# Patient Record
Sex: Male | Born: 1935 | Race: White | Hispanic: No | Marital: Single | Smoking: Never smoker
Health system: Southern US, Community
[De-identification: ages and names within clinical notes are randomized; demographics above are authoritative.]

## PROBLEM LIST (undated history)

## (undated) DIAGNOSIS — N390 Urinary tract infection, site not specified: Secondary | ICD-10-CM

## (undated) DIAGNOSIS — M1711 Unilateral primary osteoarthritis, right knee: Secondary | ICD-10-CM

## (undated) DIAGNOSIS — N200 Calculus of kidney: Secondary | ICD-10-CM

## (undated) DIAGNOSIS — N400 Enlarged prostate without lower urinary tract symptoms: Secondary | ICD-10-CM

## (undated) DIAGNOSIS — J189 Pneumonia, unspecified organism: Secondary | ICD-10-CM

## (undated) DIAGNOSIS — R0602 Shortness of breath: Secondary | ICD-10-CM

## (undated) DIAGNOSIS — E039 Hypothyroidism, unspecified: Secondary | ICD-10-CM

## (undated) DIAGNOSIS — M549 Dorsalgia, unspecified: Secondary | ICD-10-CM

## (undated) DIAGNOSIS — M1990 Unspecified osteoarthritis, unspecified site: Secondary | ICD-10-CM

## (undated) DIAGNOSIS — F419 Anxiety disorder, unspecified: Secondary | ICD-10-CM

## (undated) DIAGNOSIS — G8929 Other chronic pain: Secondary | ICD-10-CM

## (undated) DIAGNOSIS — G500 Trigeminal neuralgia: Secondary | ICD-10-CM

## (undated) DIAGNOSIS — I10 Essential (primary) hypertension: Secondary | ICD-10-CM

## (undated) HISTORY — PX: HERNIA REPAIR: SHX51

## (undated) HISTORY — PX: EYE SURGERY: SHX253

## (undated) HISTORY — DX: Unilateral primary osteoarthritis, right knee: M17.11

## (undated) HISTORY — PX: HEMORRHOID SURGERY: SHX153

---

## 2003-07-30 ENCOUNTER — Encounter: Payer: Self-pay | Admitting: *Deleted

## 2003-07-30 ENCOUNTER — Encounter: Admission: RE | Admit: 2003-07-30 | Discharge: 2003-07-30 | Payer: Self-pay | Admitting: *Deleted

## 2004-01-29 ENCOUNTER — Emergency Department (HOSPITAL_COMMUNITY): Admission: EM | Admit: 2004-01-29 | Discharge: 2004-01-29 | Payer: Self-pay | Admitting: Family Medicine

## 2006-11-17 ENCOUNTER — Ambulatory Visit: Payer: Self-pay | Admitting: Internal Medicine

## 2007-06-07 ENCOUNTER — Encounter: Admission: RE | Admit: 2007-06-07 | Discharge: 2007-06-07 | Payer: Self-pay | Admitting: Neurology

## 2007-07-19 ENCOUNTER — Ambulatory Visit: Payer: Self-pay | Admitting: Hematology & Oncology

## 2007-08-11 LAB — CBC WITH DIFFERENTIAL/PLATELET
Eosinophils Absolute: 0.1 10*3/uL (ref 0.0–0.5)
HCT: 40.3 % (ref 38.7–49.9)
LYMPH%: 23.7 % (ref 14.0–48.0)
MONO#: 0.5 10*3/uL (ref 0.1–0.9)
NEUT#: 3.8 10*3/uL (ref 1.5–6.5)
Platelets: 233 10*3/uL (ref 145–400)
RBC: 4.24 10*6/uL (ref 4.20–5.71)
WBC: 5.8 10*3/uL (ref 4.0–10.0)
lymph#: 1.4 10*3/uL (ref 0.9–3.3)

## 2007-08-11 LAB — CHCC SMEAR

## 2007-08-15 LAB — SPEP & IFE WITH QIG
Albumin ELP: 60.9 % (ref 55.8–66.1)
Alpha-1-Globulin: 3.6 % (ref 2.9–4.9)
Alpha-2-Globulin: 10.1 % (ref 7.1–11.8)
Total Protein, Serum Electrophoresis: 6.6 g/dL (ref 6.0–8.3)

## 2007-08-15 LAB — COMPREHENSIVE METABOLIC PANEL
ALT: 11 U/L (ref 0–53)
Albumin: 4.1 g/dL (ref 3.5–5.2)
CO2: 21 mEq/L (ref 19–32)
Calcium: 9 mg/dL (ref 8.4–10.5)
Chloride: 109 mEq/L (ref 96–112)
Creatinine, Ser: 0.78 mg/dL (ref 0.40–1.50)
Potassium: 4 mEq/L (ref 3.5–5.3)
Total Protein: 6.6 g/dL (ref 6.0–8.3)

## 2007-08-15 LAB — BETA 2 MICROGLOBULIN, SERUM: Beta-2 Microglobulin: 1.52 mg/L (ref 1.01–1.73)

## 2007-08-17 LAB — UIFE/LIGHT CHAINS/TP QN, 24-HR UR
Alpha 1, Urine: DETECTED — AB
Alpha 2, Urine: DETECTED — AB
Free Kappa Lt Chains,Ur: 18 mg/dL — ABNORMAL HIGH (ref 0.04–1.51)
Free Kappa/Lambda Ratio: 163.64 ratio — ABNORMAL HIGH (ref 0.46–4.00)
Free Lambda Excretion/Day: 1.65 mg/d
Free Lambda Lt Chains,Ur: 0.11 mg/dL (ref 0.08–1.01)
Free Lt Chn Excr Rate: 270 mg/d
Total Protein, Urine: 19.1 mg/dL

## 2008-01-23 ENCOUNTER — Inpatient Hospital Stay (HOSPITAL_COMMUNITY): Admission: AD | Admit: 2008-01-23 | Discharge: 2008-01-30 | Payer: Self-pay | Admitting: Family Medicine

## 2008-01-23 ENCOUNTER — Ambulatory Visit: Payer: Self-pay | Admitting: Family Medicine

## 2008-02-20 ENCOUNTER — Emergency Department (HOSPITAL_COMMUNITY): Admission: EM | Admit: 2008-02-20 | Discharge: 2008-02-20 | Payer: Self-pay | Admitting: Emergency Medicine

## 2008-03-12 ENCOUNTER — Ambulatory Visit (HOSPITAL_COMMUNITY): Admission: RE | Admit: 2008-03-12 | Discharge: 2008-03-12 | Payer: Self-pay | Admitting: Internal Medicine

## 2010-04-23 ENCOUNTER — Ambulatory Visit (HOSPITAL_COMMUNITY): Admission: RE | Admit: 2010-04-23 | Discharge: 2010-04-23 | Payer: Self-pay | Admitting: Internal Medicine

## 2011-03-17 NOTE — Discharge Summary (Signed)
Mark Berger, Mark Berger             ACCOUNT NO.:  [id number]   MEDICAL RECORD NO.:  [id number]          PATIENT TYPE:  INP   LOCATION:  5511                         FACILITY:  [facility]   PHYSICIAN:  Zenaida Deed. Mayford Knife, M.D.DATE OF BIRTH:  11/07/1923   DATE OF ADMISSION:  01/23/2008  DATE OF DISCHARGE:  01/30/2008                               DISCHARGE SUMMARY   ADDENDUM   The patient was kept over the weekend and waiting for a bed at an  assisted living facility.  The patient had no changes over the weekend  and was felt stable for discharge on Monday, January 30, 2008.      Ancil Boozer, MD  Electronically Signed      Zenaida Deed. Mayford Knife, M.D.  Electronically Signed    SA/MEDQ  D:  01/30/2008  T:  01/31/2008  Job:  [id number]

## 2011-03-17 NOTE — H&P (Signed)
Mark Berger, Mark Berger             ACCOUNT NO.:  [id number]   MEDICAL RECORD NO.:  [id number]          PATIENT TYPE:  INP   LOCATION:  5511                         FACILITY:  [facility]   PHYSICIAN:  Pearlean Brownie, M.D.DATE OF BIRTH:  12/18/1923   DATE OF ADMISSION:  01/23/2008  DATE OF DISCHARGE:                              HISTORY & PHYSICAL   PRIMARY CARE PHYSICIAN:  [facility].   CHIEF COMPLAINT:  Back pain, unable to get out of bed.   HISTORY OF PRESENT ILLNESS:  An 77 year old white male injured his lower  back Saturday morning lifting a bag of mulch.  He was lifting the mulch  up and heard and felt a loud pop.  He was immediately to hot tub at [facility]  health club trying to loosen it back up, but did not obtain any relief  from this and went from there to [facility] where he was  diagnosed with musculoskeletal back strain.  Pain continued to worsen  over the weekend to the point that he is unable to get out of bed  because he is unable to rotate due to the pain.  He lives alone and his  neighbor is helping him.  He can ambulate with a cane.  He presented to  [facility] this morning and was sent for admission evaluations  due to worsening symptoms and an inability take care of self at home.  He denies any incontinence, saddle anesthesia, or fevers.  He had been  on Skelaxin and Vicodin since Saturday which did not help all that much.  He does have a 10-12-year history of low back pain which has been  followed periodically by a chiropractor, but he has never had any pain  with similar to this.  That he denies any history of prior back injuries  or trauma.   PAST MEDICAL HISTORY:  1. BPH.  2. Insomnia.  3. Trigeminal arthralgia.  4.   Constipation.  1. Diverticulosis found on colonoscopy, no history of GI bleed.  2. Monoclonal gammopathy of undetermined significance.  Negative bone      scan in August 2008.   MEDICATIONS:  1. Doxazosin 2 mg p.o.  nightly.  2. Lorazepam 0.5 mg p.o. nightly.  3. MiraLax p.r.n.  4. Nasacort.  5. Skelaxin 800 mg p.o. t.i.d. started March 20.  6. Vicodin 5/500 one p.o. q.4-6h p.r.n. started March 20.   ALLERGIES:  No known drug allergies.   FAMILY HISTORY:  Noncontributory.  No history of cancer.  His parents  and siblings all lived well into their 22s and 77s.   SOCIAL HISTORY:  As he is single and has never been married.  He has no  children and lives alone.  He performs all ADLs and IADLs.  He denies  any alcohol or tobacco use.  He retired 16 years ago, form [employer].   REVIEW OF SYSTEMS:  Positive constipation x3 days also decreased  appetite over the past 3 days.  He denies any rashes, numbness,  tingling.  No sciatic or radiation of the back pain.  No dysuria  or  hematuria.   PHYSICAL EXAM:  Vital signs are pending.  GENERAL:  He is alert, no acute distress at rest, but he is very  uncomfortable with any movement.  HEENT: Pupils are equal, round, and reactive to light and accommodation.  Extraocular motions intact.  Mucous membranes are moist.  CARDIOVASCULAR: Regular rate and rhythm.  No murmurs, rubs or gallops.  Lungs are clear to auscultation bilaterally.  Abdominal exam positive bowel sounds, soft, nontender, nondistended.  EXTREMITIES: No edema.  NEURO:  He is alert and oriented x3.  Cranial nerves 2-12 grossly  intact.  5/5 upper and lower strength.  With 2+ DTRs symmetric.  MUSCULOSKELETAL:  Back with full range of motion with flexion but  decreased range of motion with extension, rotation and lateral bending  secondary to pain.  He is without bony tenderness.  He does have some  tenderness to palpation the level of L3-4 on the left side paraspinal.  Negative seated straight leg raise.  Normal toe walk.  He is unable to  do heel walk due to pain, but he does have 5/5 dorsiflexion, plantar  flexion, and FHL strength.  Sensation is grossly normal.  He is able to   ambulate with a very antalgic gait.   LABS/IMAGING:  None available at this time.   ASSESSMENT/PLAN:  An 77 year old male with severe low back pain.  1. Low back pain, no red flag suggesting surgical emergency.  However,      he is incapacitated.  We will admit for observation, check L-spine      films.  Pain control with Vicodin, Skelaxin, and warm compresses.      Will have physical therapy evaluate for home needs as well as      treatment modalities.  Further imaging dependent on x-ray results.  2. Constipation.  Will start scheduled MiraLax.  3. SVN regular diet.  Will check a serum albumin.  4. Deep vein thrombosis prophylaxis.  Will start Lovenox.  5. Benign prostatic hypertrophy.  No acute issues.  Continue      doxazosin.  6. Insomnia.  No acute issues.  Will continue low-dose lorazepam as      needed.   DISPOSITION:  Pending evaluation and ability to care for self at home      Benn Moulder, M.D.  Electronically Signed      Pearlean Brownie, M.D.  Electronically Signed    MR/MEDQ  D:  01/23/2008  T:  01/23/2008  Job:  [id number]   cc:   [facility]

## 2011-03-17 NOTE — Discharge Summary (Signed)
Mark Berger, Mark Berger             ACCOUNT NO.:  [id number]   MEDICAL RECORD NO.:  [id number]          PATIENT TYPE:  INP   LOCATION:  5511                         FACILITY:  [facility]   PHYSICIAN:  Pearlean Brownie, BergerDATE OF BIRTH:  1923-12-12   DATE OF ADMISSION:  01/23/2008  DATE OF DISCHARGE:                               DISCHARGE SUMMARY   REASON FOR ADMISSION:  Acute on chronic back pain with inability to  complete activities of daily living.   DISCHARGE DIAGNOSES:  1. Acute on chronic back pain.  2. Benign prostatic hypertrophy.  3. Insomnia.  4. Constipation.  5. History of trigeminal neuralgia.   DISCHARGE MEDICATIONS:  1. Doxazosin 2 mg p.o. q.h.s.  2. Lorazepam 0.5 mg p.o. q.h.s.  3. Nasacort nasal spray p.r.n.  4. Vicodin 5/500 q. 4-6 hours p.r.n. pain.  5. MiraLax 17 gram packet p.o. 1-2 times daily p.r.n. constipation.  6. Colace 100 mg p.o. q. day while taking Vicodin.  7. Ibuprofen 600 mg p.o. q. 6 hours p.r.n.  8. Calcium 500 mg p.o. t.i.d.  9. Vitamin D 400 international units p.o. b.i.d.  10.Protonix  40 mg p.o. q. day.   CONSULTATIONS:  There was a consultation for physical therapy and  occupational therapy.  Also, a social work and case management consult.   PROCEDURES AND STUDIES:  Plain films of the thoracic and lumbar spine on  March 23rd revealed a T12 wedge compression fracture which was new since  June 07, 2007, and mild lumbar degenerative changes but otherwise was  unremarkable.   LABORATORY DATA:  On admission, patient's CBC revealed white blood cell  count 8.3, hemoglobin 13.9, hematocrit 41.4, platelet count 207.  Comprehensive metabolic panel was within normal limits except for a  glucose of 173.  By the time of discharge, patient's basic metabolic  panel was within normal limits with the exception of a glucose of 112,  and Mark CBC remained unchanged.   HOSPITAL COURSE:  1. Back pain.  Mark Berger is a pleasant 77 year old gentleman  who      presented with acute on chronic back pain after lifting a heavy bag      at home.  Patient had no red flag symptoms but continued to have      significant pain and difficulty with ambulation at time of      admission.  He was unable to do Mark activities of daily living at      time of admissionl; therefore, he was admitted.  X-rays were      performed and information was noted as above.  It was unclear      whether or not this fracture was acute in terms of happened with      Mark lifting or if it had happened earlier in time.  For this      reason, it was not felt that the patient would benefit from an      orthopedic consultation for a consideration of vertebroplasty or      kyphoplasty.  Patient was started on calcitonin for bony pain,      which is  sometimes helpful in acute back pain;however, this did not      provide significant relief.  The patient was started on scheduled      Vicodin and ibuprofen, which did help Mark pain enough that he was      able to cooperate with physical therapy and occupational therapy.      He improved slowly through their help, but family members and the      patient himself did not feel comfortable with him going home in Mark      current state.  Thus, it was pursued via social work to get the      patient to an assisted living facility with physical therapy and      occupational therapy while he is there.  We feel that he will do      well in this situation.  2. Benign prostatic hypertrophy.  The patient was continued on Mark      doxazosin.  This is actually an older medication for treatment of      benign prostatic hypertrophy.  The patient may benefit from either      addition of a beta blocker to avoid extra strain on the heart of      the unopposed alpha or a change to a medication such as Flomax for      Mark benign prostatic hypertrophy, as this would not have the      effects on the heart.  We have deferred this change to Mark primary       care physician.  3. Insomnia.  The patient was continued on Mark Ativan per Mark home      regimen and tolerated this well.  4. Constipation.  The patient initially on x-ray appeared to have a      moderate gas and stool burden.  For this reason, he was started on      MiraLax, Colace and actually Milk of Magnesia was added as well.      By day 2, Mark bowel movements had regulated and by time of      discharge, he had no problems with Mark bowel movements.  He will be      continued on the medications as listed above to help Mark bowel      movements stay regular.   INSTRUCTIONS AND FOLLOWUP:  The patient will be going to assisted living  facility.  He is to follow a low sodium, heart healthy diet.  He is to  increase Mark activity slowly and work with physical therapy and  occupational therapy per their recommendations.  He is to return to Mark  primary M.D. in approximately 1-2 weeks and will need to call for this  appointment.  I would recommend to Mark primary M.D. the change as above  with Flomax but also a consideration of getting a DEXA scan to eval for  osteoporosis and starting a bisphosphonate given the patient's recent  vertebral fracture.  He may in fact have osteoporosis and would benefit  from this treatment.      Ancil Boozer, MD  Electronically Signed      Pearlean Brownie, M.D.  Electronically Signed    SA/MEDQ  D:  01/27/2008  T:  01/27/2008  Job:  [id number]

## 2011-03-20 NOTE — Assessment & Plan Note (Signed)
[facility]                         GASTROENTEROLOGY OFFICE NOTE   Mark Berger, Mark Berger                    MRN:          [id number]  DATE:11/17/2006                            DOB:          29-Dec-1923    REFERRING PHYSICIAN:  Gaspar Garbe, M.D.   REASON FOR CONSULTATION:  Constipation.   HISTORY:  This is an 77 year old white male who is referred for ongoing  complaints with constipation.  The patient reports having had regular  bowel habits as manifested by two nicely-formed bowel movements each  morning until about 3 years ago.  Thereafter he has noticed that he has  had to strain and may even go a day without having a bowel movement.  For these complaints, he was directly referred for colonoscopy August 21, 2004.  The examination revealed marked left-sided diverticulosis and  internal hemorrhoids.  He has not been seen since.  The patient has also  had problems with benign prostatic hypertrophy and obstructive uropathy.  The patient himself feels that his urologic problems are in part  secondary to his constipation.  For his bowels, he takes one stool  softener daily and Metamucil intermittently.  This does help some.  He  denies nausea, vomiting, abdominal pain, or hematochezia.  He has lost  21 pounds but this is strictly due to dietary change at the  recommendation of his internist.   PAST MEDICAL HISTORY:  1. Benign prostatic hypertrophy with a history of obstructive      uropathy.  2. Incidental diverticulosis.  3. Status post inguinal hernia repair on the left.  4. Status post hemorrhoidectomy in 1961.  5. Status post cataract surgery June 2007.   FAMILY HISTORY:  No family history of gastrointestinal malignancy.   SOCIAL HISTORY:  The patient is single without children.  He has a 12th-  grade education.  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  Does not smoke or  use  alcohol.   REVIEW OF SYSTEMS:  Per diagnostic evaluation form.   PHYSICAL EXAMINATION:  GENERAL:  Well-appearing male in no acute  distress.  VITAL SIGNS:  Blood pressure is 152/64, heart rate is 80 and regular,  weight is 204.6 pounds.  He is 5 feet 11.5 inches in height.  HEENT:  Sclerae are anicteric, conjunctivae are pink.  Oral mucosa is  intact, no adenopathy.  LUNGS:  Clear.  HEART:  Regular.  ABDOMEN:  Soft without tenderness, mass or hernia.  RECTAL:  Reveals no tenderness, mass.  Stool is brown, soft, hemoccult  negative.   IMPRESSION:  1. Functional constipation.  2. Incidental diverticulosis.   RECOMMENDATIONS:  1. Metamucil one tablespoon daily in 12 ounces of water.  2. Glycolax 17 g in 10 ounces of water daily.  The patient may titrate      this to need.  3. Resume general medical care with Dr. Wylene Simmer.     Wilhemina Bonito. Marina Goodell, MD  Electronically Signed    JNP/MedQ  DD: 11/17/2006  DT: 11/17/2006  Job #: [id number]   cc:   Adelfa Koh.  Tis[place]c, M.D.  [employer], M.D.

## 2011-04-28 ENCOUNTER — Ambulatory Visit (HOSPITAL_COMMUNITY): Payer: Medicare Other | Attending: Internal Medicine

## 2011-04-28 DIAGNOSIS — M810 Age-related osteoporosis without current pathological fracture: Secondary | ICD-10-CM | POA: Insufficient documentation

## 2011-07-27 LAB — BASIC METABOLIC PANEL
BUN: 13
BUN: 17
Calcium: 9.2
Chloride: 103
GFR calc non Af Amer: >60
GFR calc non Af Amer: >60
Glucose, Bld: 120 — ABNORMAL HIGH
Potassium: 3.8
Potassium: 4.4
Sodium: 136
Sodium: 139

## 2011-07-27 LAB — COMPREHENSIVE METABOLIC PANEL
AST: 18
Alkaline Phosphatase: 60
BUN: 16
CO2: 24
Chloride: 100
Creatinine, Ser: 0.77
GFR calc non Af Amer: >60
Potassium: 3.7
Total Bilirubin: 1.2

## 2011-07-27 LAB — CBC
HCT: 38.1 — ABNORMAL LOW
HCT: 41.4
Hemoglobin: 13.6
MCV: 97.1
Platelets: 207
RBC: 4.09 — ABNORMAL LOW
RBC: 4.26
RDW: 13.5
WBC: 5.3
WBC: 8.3
WBC: 8.4

## 2011-07-28 LAB — DIFFERENTIAL
Basophils Absolute: 0
Basophils Relative: 1
Eosinophils Relative: 3
Monocytes Absolute: 0.4
Monocytes Relative: 8

## 2011-07-28 LAB — CBC
HCT: 39
Hemoglobin: 13.3
MCHC: 34.1
RBC: 4.13 — ABNORMAL LOW
RDW: 13.4

## 2011-07-28 LAB — BASIC METABOLIC PANEL
CO2: 26
Chloride: 103
GFR calc Af Amer: >60
Glucose, Bld: 154 — ABNORMAL HIGH
Potassium: 3.6
Sodium: 137

## 2011-07-28 LAB — POCT CARDIAC MARKERS
CKMB, poc: <1 — ABNORMAL LOW
Troponin i, poc: <0.05

## 2011-12-19 ENCOUNTER — Observation Stay (HOSPITAL_COMMUNITY)
Admission: EM | Admit: 2011-12-19 | Discharge: 2011-12-20 | Disposition: A | Discharge status: Home / Self Care | Payer: Medicare Other | Attending: Internal Medicine | Admitting: Internal Medicine

## 2011-12-19 ENCOUNTER — Other Ambulatory Visit: Payer: Self-pay

## 2011-12-19 ENCOUNTER — Emergency Department (HOSPITAL_COMMUNITY): Payer: Medicare Other

## 2011-12-19 ENCOUNTER — Encounter (HOSPITAL_COMMUNITY): Payer: Self-pay

## 2011-12-19 DIAGNOSIS — N390 Urinary tract infection, site not specified: Secondary | ICD-10-CM | POA: Diagnosis present

## 2011-12-19 DIAGNOSIS — R55 Syncope and collapse: Principal | ICD-10-CM | POA: Diagnosis present

## 2011-12-19 DIAGNOSIS — R42 Dizziness and giddiness: Secondary | ICD-10-CM | POA: Insufficient documentation

## 2011-12-19 DIAGNOSIS — N400 Enlarged prostate without lower urinary tract symptoms: Secondary | ICD-10-CM | POA: Diagnosis present

## 2011-12-19 DIAGNOSIS — G8929 Other chronic pain: Secondary | ICD-10-CM

## 2011-12-19 DIAGNOSIS — G500 Trigeminal neuralgia: Secondary | ICD-10-CM | POA: Diagnosis present

## 2011-12-19 DIAGNOSIS — M545 Low back pain, unspecified: Secondary | ICD-10-CM | POA: Insufficient documentation

## 2011-12-19 DIAGNOSIS — R404 Transient alteration of awareness: Secondary | ICD-10-CM | POA: Insufficient documentation

## 2011-12-19 DIAGNOSIS — M179 Osteoarthritis of knee, unspecified: Secondary | ICD-10-CM | POA: Diagnosis present

## 2011-12-19 DIAGNOSIS — M1710 Unilateral primary osteoarthritis, unspecified knee: Secondary | ICD-10-CM | POA: Insufficient documentation

## 2011-12-19 HISTORY — DX: Unspecified osteoarthritis, unspecified site: M19.90

## 2011-12-19 HISTORY — DX: Trigeminal neuralgia: G50.0

## 2011-12-19 LAB — COMPREHENSIVE METABOLIC PANEL
Albumin: 3.9 g/dL (ref 3.5–5.2)
Alkaline Phosphatase: 56 U/L (ref 39–117)
BUN: 15 mg/dL (ref 6–23)
CO2: 25 mEq/L (ref 19–32)
Chloride: 101 mEq/L (ref 96–112)
Creatinine, Ser: 0.77 mg/dL (ref 0.50–1.35)
GFR calc Af Amer: >90 mL/min (ref >90)
GFR calc non Af Amer: 80 mL/min — ABNORMAL LOW (ref >90)
Glucose, Bld: 123 mg/dL — ABNORMAL HIGH (ref 70–99)
Potassium: 4.2 mEq/L (ref 3.5–5.1)
Total Bilirubin: 0.7 mg/dL (ref 0.3–1.2)

## 2011-12-19 LAB — URINALYSIS, ROUTINE W REFLEX MICROSCOPIC
Glucose, UA: >1000 mg/dL — AB
Ketones, ur: 15 mg/dL — AB
Nitrite: POSITIVE — AB
Protein, ur: 30 mg/dL — AB
Urobilinogen, UA: 0.2 mg/dL (ref 0.0–1.0)

## 2011-12-19 LAB — CBC
HCT: 43.1 % (ref 39.0–52.0)
HCT: 41.4 % (ref 39.0–52.0)
Hemoglobin: 14.9 g/dL (ref 13.0–17.0)
MCH: 32.5 pg (ref 26.0–34.0)
MCHC: 34.6 g/dL (ref 30.0–36.0)
MCV: 93.5 fL (ref 78.0–100.0)
Platelets: 218 10*3/uL (ref 150–400)
RBC: 4.59 MIL/uL (ref 4.22–5.81)
RBC: 4.43 MIL/uL (ref 4.22–5.81)
WBC: 7.6 10*3/uL (ref 4.0–10.5)

## 2011-12-19 LAB — CREATININE, SERUM
GFR calc Af Amer: >90 mL/min (ref >90)
GFR calc non Af Amer: 82 mL/min — ABNORMAL LOW (ref >90)

## 2011-12-19 LAB — DIFFERENTIAL
Basophils Relative: 0 % (ref 0–1)
Lymphs Abs: 1.1 10*3/uL (ref 0.7–4.0)
Monocytes Absolute: 0.7 10*3/uL (ref 0.1–1.0)
Monocytes Relative: 9 % (ref 3–12)
Neutro Abs: 5.7 10*3/uL (ref 1.7–7.7)
Neutrophils Relative %: 76 % (ref 43–77)

## 2011-12-19 LAB — URINE MICROSCOPIC-ADD ON

## 2011-12-19 LAB — CARDIAC PANEL(CRET KIN+CKTOT+MB+TROPI)
Relative Index: INVALID (ref 0.0–2.5)
Total CK: 39 U/L (ref 7–232)

## 2011-12-19 LAB — APTT: aPTT: 26 seconds (ref 24–37)

## 2011-12-19 MED ORDER — ZOLPIDEM TARTRATE 5 MG PO TABS: 5.0000 mg | ORAL_TABLET | Freq: Every evening | ORAL | Status: DC | PRN

## 2011-12-19 MED ORDER — HYDROCODONE-ACETAMINOPHEN 5-325 MG PO TABS: 1.0000 | ORAL_TABLET | ORAL | Status: DC | PRN

## 2011-12-19 MED ORDER — SODIUM CHLORIDE 0.9 % IV SOLN: INTRAVENOUS | Status: DC

## 2011-12-19 MED ORDER — CIPROFLOXACIN IN D5W 400 MG/200ML IV SOLN
400.0000 mg | Freq: Once | INTRAVENOUS | Status: AC
  Administered 2011-12-19: 400 mg via INTRAVENOUS
  Filled 2011-12-19: qty 200

## 2011-12-19 MED ORDER — POTASSIUM CHLORIDE IN NACL 20-0.9 MEQ/L-% IV SOLN
INTRAVENOUS | Status: DC
  Administered 2011-12-19: 21:00:00 via INTRAVENOUS
  Filled 2011-12-19 (×2): qty 1000

## 2011-12-19 MED ORDER — ACETAMINOPHEN 325 MG PO TABS: 650.0000 mg | ORAL_TABLET | Freq: Four times a day (QID) | ORAL | Status: DC | PRN

## 2011-12-19 MED ORDER — ENOXAPARIN SODIUM 40 MG/0.4ML ~~LOC~~ SOLN
40.0000 mg | SUBCUTANEOUS | Status: DC
  Administered 2011-12-19: 40 mg via SUBCUTANEOUS
  Filled 2011-12-19 (×2): qty 0.4

## 2011-12-19 MED ORDER — SODIUM CHLORIDE 0.9 % IV BOLUS (SEPSIS)
500.0000 mL | Freq: Once | INTRAVENOUS | Status: AC
  Administered 2011-12-19: 500 mL via INTRAVENOUS

## 2011-12-19 MED ORDER — NAPROXEN 250 MG PO TABS
250.0000 mg | ORAL_TABLET | ORAL | Status: AC
  Administered 2011-12-19: 250 mg via ORAL
  Filled 2011-12-19: qty 1

## 2011-12-19 MED ORDER — ASPIRIN EC 325 MG PO TBEC
325.0000 mg | DELAYED_RELEASE_TABLET | Freq: Every day | ORAL | Status: DC
  Administered 2011-12-19 – 2011-12-20 (×2): 325 mg via ORAL
  Filled 2011-12-19 (×2): qty 1

## 2011-12-19 MED ORDER — NAPROXEN 250 MG PO TABS
250.0000 mg | ORAL_TABLET | Freq: Two times a day (BID) | ORAL | Status: DC
  Administered 2011-12-19 – 2011-12-20 (×2): 250 mg via ORAL
  Filled 2011-12-19 (×4): qty 1

## 2011-12-19 MED ORDER — SODIUM CHLORIDE 0.9 % IJ SOLN
3.0000 mL | Freq: Two times a day (BID) | INTRAMUSCULAR | Status: DC
  Administered 2011-12-19: 3 mL via INTRAVENOUS

## 2011-12-19 MED ORDER — CALCIUM CARBONATE-VITAMIN D 500-200 MG-UNIT PO TABS
1.0000 | ORAL_TABLET | Freq: Every day | ORAL | Status: DC
  Administered 2011-12-19: 1 via ORAL
  Filled 2011-12-19 (×2): qty 1

## 2011-12-19 MED ORDER — CIPROFLOXACIN HCL 500 MG PO TABS
500.0000 mg | ORAL_TABLET | Freq: Two times a day (BID) | ORAL | Status: DC
  Administered 2011-12-19 – 2011-12-20 (×2): 500 mg via ORAL
  Filled 2011-12-19 (×4): qty 1

## 2011-12-19 MED ORDER — OMEGA-3-ACID ETHYL ESTERS 1 G PO CAPS
3.0000 g | ORAL_CAPSULE | Freq: Two times a day (BID) | ORAL | Status: DC
  Administered 2011-12-19 – 2011-12-20 (×2): 3 g via ORAL
  Filled 2011-12-19 (×3): qty 3

## 2011-12-19 MED ORDER — SIMVASTATIN 20 MG PO TABS
20.0000 mg | ORAL_TABLET | Freq: Every day | ORAL | Status: DC
  Administered 2011-12-19 – 2011-12-20 (×2): 20 mg via ORAL
  Filled 2011-12-19 (×2): qty 1

## 2011-12-19 MED ORDER — SENNOSIDES-DOCUSATE SODIUM 8.6-50 MG PO TABS
1.0000 | ORAL_TABLET | Freq: Every evening | ORAL | Status: DC | PRN
  Filled 2011-12-19: qty 1

## 2011-12-19 MED ORDER — ACETAMINOPHEN 650 MG RE SUPP: 650.0000 mg | Freq: Four times a day (QID) | RECTAL | Status: DC | PRN

## 2011-12-19 MED ORDER — LORAZEPAM 0.5 MG PO TABS
0.5000 mg | ORAL_TABLET | Freq: Every day | ORAL | Status: DC
  Administered 2011-12-19: 0.5 mg via ORAL
  Filled 2011-12-19: qty 1

## 2011-12-19 MED ORDER — NAPROXEN 250 MG PO TABS
250.0000 mg | ORAL_TABLET | Freq: Three times a day (TID) | ORAL | Status: DC
  Administered 2011-12-20: 250 mg via ORAL
  Filled 2011-12-19 (×4): qty 1

## 2011-12-19 MED ORDER — NAPROXEN SODIUM 220 MG PO TABS: 220.0000 mg | ORAL_TABLET | Freq: Three times a day (TID) | ORAL | Status: DC

## 2011-12-19 MED ORDER — BISACODYL 10 MG RE SUPP: 10.0000 mg | Freq: Every day | RECTAL | Status: DC | PRN

## 2011-12-19 NOTE — ED Notes (Signed)
Attempted to give floor report unable

## 2011-12-19 NOTE — ED Notes (Signed)
Pt reports he was eating at a restaurant with a friend and he had a "full sensation in stomach, pt began feeling bad, dizzy, and became very sweaty across the brow".  Per EMS, pt was lowered to the floor by his friend.  Upon EMS arrival pt was alert and responsive.  Orthostatic BP completed by EMS:  Lying 150/84, Sitting 130/84, Standing 140/P.  Per EMS pulse stayed in the 90's, CBG 125, EKG was NSR.

## 2011-12-19 NOTE — ED Provider Notes (Signed)
History     CSN: [id number]  Arrival date & time 12/19/11  1250   First MD Initiated Contact with Patient 12/19/11 1305      Chief Complaint  Patient presents with  . Loss of Consciousness    (Consider location/radiation/quality/duration/timing/severity/associated sxs/prior treatment) HPI Pt states was in normal state of health eating at restaurant and had full feeling. Became light headed and unresponsive and was gently lowered to floor. Pt stated he remembers being lowered to the floor. Friend stated he had some jerking motions but no bowel or bladder incontinence. No post ictal phase. Pt says he feel back to normal. Denies CP, SOB fever, HA, neck pain, N/V, abdominal pain.   Past Medical History  Diagnosis Date  . Arthritis   . Trigeminal neuralgia     Past Surgical History  Procedure Date  . Hernia repair   . Hemmor   . Hemorrhoid surgery     History reviewed. No pertinent family history.  History  Substance Use Topics  . Smoking status: Never Smoker   . Smokeless tobacco: Not on file  . Alcohol Use: No      Review of Systems  Constitutional: Negative for fever and chills.  HENT: Negative for neck pain.   Respiratory: Negative for cough, shortness of breath and wheezing.   Cardiovascular: Negative for chest pain, palpitations and leg swelling.  Gastrointestinal: Negative for nausea, vomiting and abdominal pain.  Genitourinary: Negative for dysuria and flank pain.  Musculoskeletal: Negative for back pain and joint swelling.  Neurological: Positive for dizziness, syncope and light-headedness. Negative for weakness, numbness and headaches.    Allergies  Review of patient's allergies indicates no known allergies.  Home Medications  No current outpatient prescriptions on file.  BP 137/73  Pulse 88  Temp(Src) 98 F (36.7 C) (Oral)  Resp 19  Ht 5\' 10"  (1.778 m)  Wt 206 lb 5.6 oz (93.6 kg)  BMI 29.61 kg/m2  SpO2 95%  Physical Exam  Nursing note and  vitals reviewed. Constitutional: He is oriented to person, place, and time. He appears well-developed and well-nourished. No distress.  HENT:  Head: Normocephalic and atraumatic.  Mouth/Throat: Oropharynx is clear and moist.  Eyes: EOM are normal. Pupils are equal, round, and reactive to light.  Neck: Normal range of motion. Neck supple.       No post cervical midline TTP   Cardiovascular: Normal rate and regular rhythm.   Pulmonary/Chest: Effort normal and breath sounds normal. No respiratory distress. He has no wheezes. He has no rales.  Abdominal: Soft. Bowel sounds are normal. He exhibits no distension. There is no tenderness. There is no rebound and no guarding.  Musculoskeletal: Normal range of motion. He exhibits no edema and no tenderness.  Neurological: He is alert and oriented to person, place, and time.       Finger-to-nose intact, 5/5 motor in all ext, sensation normal  Skin: Skin is warm and dry. No rash noted. No erythema.  Psychiatric: He has a normal mood and affect. His behavior is normal.    ED Course  Procedures (including critical care time)  Labs Reviewed  COMPREHENSIVE METABOLIC PANEL - Abnormal; Notable for the following:    Glucose, Bld 123 (*)    GFR calc non Af Amer 80 (*)    All other components within normal limits  URINALYSIS, ROUTINE W REFLEX MICROSCOPIC - Abnormal; Notable for the following:    Glucose, UA >1000 (*)    Ketones, ur 15 (*)  Protein, ur 30 (*)    Nitrite POSITIVE (*)    Leukocytes, UA SMALL (*)    All other components within normal limits  URINE MICROSCOPIC-ADD ON - Abnormal; Notable for the following:    Bacteria, UA MANY (*)    All other components within normal limits  CREATININE, SERUM - Abnormal; Notable for the following:    GFR calc non Af Amer 82 (*)    All other components within normal limits  COMPREHENSIVE METABOLIC PANEL - Abnormal; Notable for the following:    Glucose, Bld 114 (*)    Total Protein 5.9 (*)     Albumin 3.3 (*)    GFR calc non Af Amer 80 (*)    All other components within normal limits  CBC  DIFFERENTIAL  CARDIAC PANEL(CRET KIN+CKTOT+MB+TROPI)  PROTIME-INR  APTT  TROPONIN I  CBC  URINE CULTURE  CBC   Ct Head Wo Contrast  12/19/2011  *RADIOLOGY REPORT*  Clinical Data: Syncope.  CT HEAD WITHOUT CONTRAST  Technique:  Contiguous axial images were obtained from the base of the skull through the vertex without contrast.  Comparison: None.  Findings: Partial opacification left mastoid air cells.  No bony destructive lesion.  No intracranial hemorrhage.  Global atrophy without hydrocephalus.  Small vessel disease type changes. No CT evidence of large acute infarct.  Small acute infarct cannot be excluded by CT.  No intracranial mass lesion detected on this unenhanced exam.  Calcified vessels.  IMPRESSION: Global atrophy without hydrocephalus.  Small vessel disease type changes.  No intracranial hemorrhage or CT evidence of large acute infarct.  Partial opacification left mastoid air cells.  Original Report Authenticated By: Fuller Canada, M.D.     1. Syncope   2. UTI (lower urinary tract infection)      Date: 12/19/2011  Rate: 88  Rhythm: normal sinus rhythm  QRS Axis: normal  Intervals: normal  ST/T Wave abnormalities: normal  Conduction Disutrbances:none  Narrative Interpretation:   Old EKG Reviewed: unchanged      MDM          Loren Racer, MD 12/20/11 [phone]
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
## 2011-12-19 NOTE — ED Notes (Signed)
EMERGENCY CONTACT:    Lodema Pilot  [phone]

## 2011-12-19 NOTE — ED Notes (Signed)
Pt's friend now at bedside.  She states:  "color drained from his face, I ran around to him and he felt as if he was having mild convulsing, and then we lowered him to the floor."  Pt's friend states that she is not sure, but it may have lasted 30 to 45 seconds.

## 2011-12-20 ENCOUNTER — Other Ambulatory Visit: Payer: Self-pay

## 2011-12-20 LAB — COMPREHENSIVE METABOLIC PANEL
ALT: 12 U/L (ref 0–53)
AST: 12 U/L (ref 0–37)
Albumin: 3.3 g/dL — ABNORMAL LOW (ref 3.5–5.2)
CO2: 24 mEq/L (ref 19–32)
Calcium: 9.3 mg/dL (ref 8.4–10.5)
Chloride: 105 mEq/L (ref 96–112)
GFR calc non Af Amer: 80 mL/min — ABNORMAL LOW (ref >90)
Sodium: 138 mEq/L (ref 135–145)

## 2011-12-20 LAB — TROPONIN I: Troponin I: <0.3 ng/mL (ref <0.30)

## 2011-12-20 MED ORDER — CIPROFLOXACIN HCL 500 MG PO TABS: 500.0000 mg | ORAL_TABLET | Freq: Two times a day (BID) | ORAL | Status: AC

## 2011-12-20 NOTE — Progress Notes (Signed)
IV d/c'ed. Tele d/c'ed. D/C instructions and medications discussed with pt and pt's family. All questions answered. Pt states understanding. Pt escorted out with staff via wheelchair. Pt d/c'ed to home.

## 2011-12-20 NOTE — Progress Notes (Signed)
*  PRELIMINARY RESULTS* Echocardiogram 2D Echocardiogram has been performed.  Mark Berger [facility] 12/20/2011, 10:08 AM

## 2011-12-20 NOTE — Discharge Summary (Signed)
Physician Discharge Summary  Patient ID: Mark Berger MRN: [id number] DOB/AGE: 12-30-1923 78 y.o.  Admit date: 12/19/2011 Discharge date: 12/20/2011   Discharge Diagnoses:  Principal Problem:  *Syncope Active Problems:  Trigeminal neuralgia  Osteoarthritis of knee  Urinary tract infection  Benign prostate hyperplasia   Discharged Condition: good  Hospital Course: The patient is an 78 year old Caucasian male with several medical problems who was in his usual state of good health earlier on the day of admission. He was with a friend at [employer] where he developed a sudden feeling of malaise associated with marked lightheadedness. His friend and others in the restaurant noted that he became pale was some tonic movements and eased him to the floor. He had a brief loss of consciousness, 911 was called, and he was transported to the emergency room for evaluation. The workup in the emergency room included an EKG, labs, and a CT scan of the head which were remarkable solely for bacteriuria. At the time of my evaluation he felt fine other than his usual right ophthalmic branch distribution trigeminal neuralgia aching discomfort. He denied having recent symptoms of chest pain, palpitations, or shortness of breath. He had a similar episode of syncope that occurred while taking pain medication on an empty stomach about 5 years ago, with an emergency room evaluation at that time that was unremarkable. He has no history of coronary artery disease or arrhythmia, and there is no family history of sudden cardiac death. His review of systems is significant for recent urinary frequency and dysuria without fever or chills. Several weeks ago he had a urinary tract infection that resolved with antibiotic treatment. He does have a history of benign prostatic hypertrophy.  He was admitted to a telemetry bed. Overnight telemetry showed that he remained in a sinus rhythm with no bradycardia or tachyarrhythmias. He  had cardiac isoenzymes drawn that were normal. On the day of discharge he had a transthoracic echocardiogram done with results pending at the time of dictation.  Consults: None  Significant Diagnostic Studies:  Ct Head Wo Contrast  12/19/2011  *RADIOLOGY REPORT*  Clinical Data: Syncope.  CT HEAD WITHOUT CONTRAST  Technique:  Contiguous axial images were obtained from the base of the skull through the vertex without contrast.  Comparison: None.  Findings: Partial opacification left mastoid air cells.  No bony destructive lesion.  No intracranial hemorrhage.  Global atrophy without hydrocephalus.  Small vessel disease type changes. No CT evidence of large acute infarct.  Small acute infarct cannot be excluded by CT.  No intracranial mass lesion detected on this unenhanced exam.  Calcified vessels.  IMPRESSION: Global atrophy without hydrocephalus.  Small vessel disease type changes.  No intracranial hemorrhage or CT evidence of large acute infarct.  Partial opacification left mastoid air cells.  Original Report Authenticated By: Fuller Canada, M.D.    Labs: Lab Results  Component Value Date   WBC 7.6 12/19/2011   HGB 14.6 12/19/2011   HCT 41.4 12/19/2011   MCV 93.5 12/19/2011   PLT 218 12/19/2011     Lab 12/20/11 0455  NA 138  K 4.2  CL 105  CO2 24  BUN 16  CREATININE 0.77  CALCIUM 9.3  PROT 5.9*  BILITOT 0.9  ALKPHOS 48  ALT 12  AST 12  GLUCOSE 114*       Lab Results  Component Value Date   INR 0.93 12/19/2011     No results found for this or any previous visit (from the  past 240 hour(s)).    Discharge Exam: Blood pressure 137/73, pulse 88, temperature 98 F (36.7 C), temperature source Oral, resp. rate 19, height 5\' 10"  (1.778 m), weight 93.6 kg (206 lb 5.6 oz), SpO2 95.00%.  Physical Exam: In general, he is a well-nourished well-developed Caucasian man who was in no apparent distress. HEENT exam was within normal limits, neck was supple without jugular venous distention or  carotid bruit, chest was clear to auscultation, heart had a regular rate and rhythm without significant murmur or gallop, abdomen was benign, extremities were without edema, neurologic exam was nonfocal  Disposition: He'll be discharged from the hospital to return home in the company of his friend. He was advised to call our office early this coming week to schedule a followup visit with his primary care physician, Dr. Guerry Bruin. The results of his echocardiogram from today could be reviewed at that time.   Discharge Orders    Future Orders Please Complete By Expires   Diet - low sodium heart healthy      Increase activity slowly      Discharge instructions      Comments:   He should call [facility] at [phone] for a followup visit with his primary care physician within one week of discharge. He should call us if he has recurrent episodes of severe dizziness, fainting, shortness of breath, chest pain, or palpitations.   Call MD for:      Comments:   Call his primary care physician if he develops worsening dizziness, recurrent fainting, palpitations, chest pain, shortness of breath, or other concerning symptoms.     Medication List  As of 12/20/2011  1:58 PM   TAKE these medications         atorvastatin 10 MG tablet   Commonly known as: LIPITOR   Take 10 mg by mouth daily.      calcium-vitamin D 500-200 MG-UNIT per tablet   Commonly known as: OSCAL WITH D   Take 1 tablet by mouth at bedtime.      ciprofloxacin 500 MG tablet   Commonly known as: CIPRO   Take 1 tablet (500 mg total) by mouth 2 (two) times daily.      LORazepam 1 MG tablet   Commonly known as: ATIVAN   Take 0.5 mg by mouth at bedtime. For sleep      naproxen sodium 220 MG tablet   Commonly known as: ANAPROX   Take 220 mg by mouth 3 (three) times daily with meals.      omega-3 acid ethyl esters 1 G capsule   Commonly known as: LOVAZA   Take 3 g by mouth 2 (two) times daily.            Follow-up Information    Follow up with Gaspar Garbe, MD in 1 week.         Signed: Garlan Fillers 12/20/2011, 1:58 PM

## 2011-12-21 LAB — URINE CULTURE
Culture  Setup Time: 201302171110
Culture: NO GROWTH
Special Requests: NORMAL

## 2012-02-11 ENCOUNTER — Emergency Department (HOSPITAL_COMMUNITY)
Admission: EM | Admit: 2012-02-11 | Discharge: 2012-02-11 | Disposition: A | Discharge status: Home / Self Care | Payer: Medicare Other | Attending: Emergency Medicine | Admitting: Emergency Medicine

## 2012-02-11 ENCOUNTER — Encounter (HOSPITAL_COMMUNITY): Payer: Self-pay | Admitting: Emergency Medicine

## 2012-02-11 ENCOUNTER — Emergency Department (HOSPITAL_COMMUNITY): Payer: Medicare Other

## 2012-02-11 DIAGNOSIS — K5900 Constipation, unspecified: Secondary | ICD-10-CM | POA: Insufficient documentation

## 2012-02-11 DIAGNOSIS — N201 Calculus of ureter: Secondary | ICD-10-CM | POA: Insufficient documentation

## 2012-02-11 DIAGNOSIS — R1031 Right lower quadrant pain: Secondary | ICD-10-CM | POA: Insufficient documentation

## 2012-02-11 DIAGNOSIS — N200 Calculus of kidney: Secondary | ICD-10-CM

## 2012-02-11 LAB — URINALYSIS, ROUTINE W REFLEX MICROSCOPIC
Glucose, UA: NEGATIVE mg/dL
Protein, ur: NEGATIVE mg/dL
Specific Gravity, Urine: 1.016 (ref 1.005–1.030)
pH: 6.5 (ref 5.0–8.0)

## 2012-02-11 LAB — BASIC METABOLIC PANEL
CO2: 22 mEq/L (ref 19–32)
Chloride: 102 mEq/L (ref 96–112)
Sodium: 134 mEq/L — ABNORMAL LOW (ref 135–145)

## 2012-02-11 LAB — CBC
HCT: 41.7 % (ref 39.0–52.0)
Platelets: 226 10*3/uL (ref 150–400)
RBC: 4.36 MIL/uL (ref 4.22–5.81)
RDW: 13.6 % (ref 11.5–15.5)
WBC: 9.2 10*3/uL (ref 4.0–10.5)

## 2012-02-11 LAB — DIFFERENTIAL
Basophils Absolute: 0 10*3/uL (ref 0.0–0.1)
Lymphocytes Relative: 13 % (ref 12–46)
Neutro Abs: 6.6 10*3/uL (ref 1.7–7.7)

## 2012-02-11 LAB — URINE MICROSCOPIC-ADD ON

## 2012-02-11 MED ORDER — KETOROLAC TROMETHAMINE 30 MG/ML IJ SOLN
30.0000 mg | Freq: Once | INTRAMUSCULAR | Status: AC
  Administered 2012-02-11: 30 mg via INTRAVENOUS

## 2012-02-11 MED ORDER — NAPROXEN 375 MG PO TABS: 375.0000 mg | ORAL_TABLET | Freq: Two times a day (BID) | ORAL | Status: DC

## 2012-02-11 MED ORDER — HYDROCODONE-ACETAMINOPHEN 5-325 MG PO TABS: 2.0000 | ORAL_TABLET | ORAL | Status: AC | PRN

## 2012-02-11 MED ORDER — IOHEXOL 300 MG/ML  SOLN
20.0000 mL | INTRAMUSCULAR | Status: AC
  Administered 2012-02-11 (×2): 20 mL via ORAL

## 2012-02-11 MED ORDER — IOHEXOL 300 MG/ML  SOLN
80.0000 mL | Freq: Once | INTRAMUSCULAR | Status: AC | PRN
  Administered 2012-02-11: 80 mL via INTRAVENOUS

## 2012-02-11 NOTE — ED Notes (Signed)
Pt to ED c/o constipation x 4 days (with 1 bm yesterday after prune juice and dulcolax).  Pt came this am b/c he began experiencing RLQ pain, but now pain is throughout abd.  BS in all quadrants.

## 2012-02-11 NOTE — ED Notes (Signed)
Patient returned to unit from CT.

## 2012-02-11 NOTE — ED Notes (Signed)
Patient with abdominal pain with diarrhea, patient took dulcolax, now having constipation.  Patient denies any nausea or vomiting.

## 2012-02-11 NOTE — ED Notes (Signed)
Patient transported to X-ray 

## 2012-02-11 NOTE — ED Notes (Signed)
78 year old male with a history of no abdominal surgery who presents with abdominal pain. He states that approximately 5 days ago he started to develop watery diarrhea, took an over-the-counter antidiarrhea medicine and then developed some constipation, then again took some antidiarrhea medicine and has had resultant cramping for the last several days. Overnight his pain focused into the right lower quadrant which make him particularly concerned for appendicitis. He denies fevers chills nausea vomiting but has had lots of flatulence with last 24 hours  Physical exam:  Well-appearing, clear heart and lungs, abdomen is soft benign and nontender at this time  Assessment:  KUB shows partially filled colon, prior abdominal exam showed right lower quadrant pain, patient is very concerned appendicitis, rule out with CT scan  Medical screening examination/treatment/procedure(s) were conducted as a shared visit with non-physician practitioner(s) and myself.  I personally evaluated the patient during the encounter   Vida Roller, MD 02/11/12 [phone]

## 2012-02-11 NOTE — ED Notes (Addendum)
Patient finished drinking contract and CT notified at [phone] that patient was ready. Patient anxious to get test over. Patient reports RLQ pain of 1/10. Patient remains on monitor and sats of 96% RA.

## 2012-02-11 NOTE — ED Provider Notes (Signed)
History     CSN: [id number]  Arrival date & time 02/11/12  0535   First MD Initiated Contact with Patient 02/11/12 [phone]      Chief Complaint  Patient presents with  . Abdominal Pain    (Consider location/radiation/quality/duration/timing/severity/associated sxs/prior treatment) HPI Comments: Patient with no significant past medical history presents to the emergency department with chief complaint of constipation.  Onset of symptoms began 4 days ago after a diarrheal episode. Associated symptoms include mild diffuse lower abdominal pain Treatments tried include Prune juice, metamucil & dulcolax. Pt denies fever, night sweats, chills, hematochezia, melena, localized abdominal pain, vomiting, change in appetite.   Patient is a 78 y.o. male presenting with constipation. The history is provided by the patient.  Constipation  The current episode started 3 to 5 days ago. The problem occurs continuously. The problem has been unchanged. The pain is mild. The stool is described as soft, hard and liquid. Prior successful therapies include enemas and stool softeners. There was no prior unsuccessful therapy. Associated symptoms include abdominal pain (mild pain that has since resolved) and diarrhea (Friday, sat & sun, took loperamide 1 tablet ea day ). Pertinent negatives include no anorexia, no fever, no hematemesis, no hemorrhoids, no nausea, no rectal pain, no vomiting, no hematuria, no vaginal bleeding, no vaginal discharge, no chest pain, no headaches, no coughing, no difficulty breathing and no rash. He has been behaving normally. He has been eating and drinking normally. His past medical history does not include abdominal surgery, developmental delay, Hirschsprung's disease, inflammatory bowel disease, recent abdominal injury, recent antibiotic use, recent change in diet or a recent illness.    Past Medical History  Diagnosis Date  . Arthritis   . Trigeminal neuralgia     Past Surgical History    Procedure Date  . Hernia repair   . Hemmor   . Hemorrhoid surgery     No family history on file.  History  Substance Use Topics  . Smoking status: Never Smoker   . Smokeless tobacco: Not on file  . Alcohol Use: No      Review of Systems  Constitutional: Negative for fever, chills and appetite change.  HENT: Negative for congestion and neck stiffness.   Eyes: Negative for visual disturbance.  Respiratory: Negative for cough and shortness of breath.   Cardiovascular: Negative for chest pain and leg swelling.  Gastrointestinal: Positive for abdominal pain (mild pain that has since resolved), diarrhea (Friday, sat & sun, took loperamide 1 tablet ea day ) and constipation. Negative for nausea, vomiting, blood in stool, abdominal distention, anal bleeding, rectal pain, anorexia, hematemesis and hemorrhoids.  Genitourinary: Negative for dysuria, urgency, frequency, hematuria, vaginal bleeding and vaginal discharge.  Skin: Negative for rash.  Neurological: Negative for dizziness, syncope, weakness, light-headedness, numbness and headaches.  Psychiatric/Behavioral: Negative for confusion.  All other systems reviewed and are negative.    Allergies  Review of patient's allergies indicates no known allergies.  Home Medications   Current Outpatient Rx  Name Route Sig Dispense Refill  . ATORVASTATIN CALCIUM 10 MG PO TABS Oral Take 10 mg by mouth daily.    Marland Kitchen BISACODYL 5 MG PO TBEC Oral Take 5 mg by mouth daily as needed. constipation    . CALCIUM CARBONATE-VITAMIN D 500-200 MG-UNIT PO TABS Oral Take 1 tablet by mouth at bedtime.    Marland Kitchen LORAZEPAM 1 MG PO TABS Oral Take 0.5 mg by mouth at bedtime. For sleep    . NAPROXEN SODIUM 220 MG PO  TABS Oral Take 220 mg by mouth 3 (three) times daily with meals. Arthritis     . OMEGA-3-ACID ETHYL ESTERS 1 G PO CAPS Oral Take 1 g by mouth 2 (two) times daily.       BP 135/77  Pulse 88  Temp(Src) 98 F (36.7 C) (Oral)  Resp 18  SpO2  96%  Physical Exam  Nursing note and vitals reviewed. Constitutional: Vital signs are normal. He appears well-developed and well-nourished. No distress.  HENT:  Head: Normocephalic and atraumatic.  Mouth/Throat: Uvula is midline, oropharynx is clear and moist and mucous membranes are normal.  Eyes: Conjunctivae and EOM are normal. Pupils are equal, round, and reactive to light.  Neck: Normal range of motion and full passive range of motion without pain. Neck supple. No spinous process tenderness and no muscular tenderness present. No rigidity. No Brudzinski's sign noted.  Cardiovascular: Normal rate and regular rhythm.   Pulmonary/Chest: Effort normal and breath sounds normal. No accessory muscle usage. Not tachypneic. No respiratory distress.  Abdominal: Soft. Normal appearance. He exhibits no distension, no ascites, no pulsatile midline mass and no mass. There is tenderness. There is no CVA tenderness. No hernia.  Lymphadenopathy:    He has no cervical adenopathy.  Neurological: He is alert.  Skin: Skin is warm and dry. No rash noted. He is not diaphoretic.  Psychiatric: He has a normal mood and affect. His speech is normal and behavior is normal.    ED Course  Procedures (including critical care time)  Labs Reviewed  DIFFERENTIAL - Abnormal; Notable for the following:    Monocytes Absolute 1.1 (*)    All other components within normal limits  BASIC METABOLIC PANEL - Abnormal; Notable for the following:    Sodium 134 (*)    Glucose, Bld 146 (*)    GFR calc non Af Amer 64 (*)    GFR calc Af Amer 74 (*)    All other components within normal limits  CBC  URINALYSIS, ROUTINE W REFLEX MICROSCOPIC   Dg Abd 1 View  02/11/2012  *RADIOLOGY REPORT*  Clinical Data: Lower abdominal pain and constipation.  ABDOMEN - 1 VIEW  Comparison: Lumbar spine radiographs performed 03/23 1009  Findings: The visualized bowel gas pattern is unremarkable.  The colon is partially filled with stool, without  significant constipation; no abnormal dilatation of small bowel loops is seen to suggest small bowel obstruction.  No free intra-abdominal air is identified, though evaluation for free air is limited on a single supine view.  The visualized osseous structures are within normal limits; the sacroiliac joints are unremarkable in appearance.  IMPRESSION: Colon partially filled with stool, without significant constipation.  Unremarkable bowel gas pattern; no free intra- abdominal air seen.  Original Report Authenticated By: Tonia Ghent, M.D.   Ct Abdomen Pelvis W Contrast  02/11/2012  *RADIOLOGY REPORT*  Clinical Data: Right lower quadrant abdominal pain, history of constipation  CT ABDOMEN AND PELVIS WITH CONTRAST  Technique:  Multidetector CT imaging of the abdomen and pelvis was performed following the standard protocol during bolus administration of intravenous contrast.  Contrast: 80mL OMNIPAQUE IOHEXOL 300 MG/ML  SOLN  Comparison: Abdominal radiograph - 02/11/2012; lumbar spine MRI - 03/07/2011  Findings:  Normal hepatic contour.  Hepatic cyst noted within the caudate lobe.  Additional sub centimeter hypoattenuating lesion within the right lobe of the liver (image 30, series 2) is too small to characterize so favored to represent additional hepatic cyst. Normal gallbladder.  No intra or extrahepatic  biliary ductal dilatation.  No ascites.  There is symmetric enhancement of the bilateral kidneys, however there is mildly delayed excretion from the right kidney.  There is asymmetric right-sided perinephric stranding and mild right-sided hydronephrosis and ureterectasis secondary to a distal punctate (1- 2 mm) stone within the distal aspect of the right ureter (image 81, series 2).  Multiple additional nonobstructing stones are seen bilaterally with index right-sided nonobstructing stone within the inferior calix of the right kidney measuring 6 mm in diameter (coronal image 69, series five) and index  nonobstructing approximately 4 mm stone within inferior calix of the left kidney (image 72, series five).  Partially exophytic left-sided renal cyst.  Incidental note is made of a likely right-sided parapelvic cyst.  No left-sided urinary obstruction.  The prostate is enlarged with mass effect upon the undersurface of the bladder.  No free fluid within the pelvis.  The bilateral adrenal glands, pancreas and spleen are normal. Incidental note is made of a small splenule.  Ingested enteric contrast extends to the level of the mid transverse colon.  Extensive colonic diverticulosis without evidence of diverticulitis.  The bowel is otherwise normal in course and caliber without wall thickening or evidence of obstruction.  The appendix is not definitively identified, however there is no inflammatory changes in the right lower abdominal quadrant.  No pneumoperitoneum, pneumatosis or portal venous gas.  Scattered minimal calcified atherosclerotic plaque within the nonaneurysmal abdominal aorta.  There is mild fusiform ectasia of the main trunk of the celiac artery measuring approximately 1.5 cm in greatest transverse axial dimension (image 27, series 2).  The major branch vessels of the abdominal aorta, including the IMA, are patent.  No retroperitoneal, mesenteric, pelvic or inguinal lymphadenopathy.  Limited visualization of lower thorax demonstrates minimal dependent reticular opacities, favored to represent atelectasis or scar.  Indeterminate 3 mm noncalcified nodule in the right lower lobe (image 9, series 3).  No pleural effusion.  Normal heart size.  Coronary artery calcifications.  Calcifications within the mitral valve annulus. No pericardial effusion.  No acute or aggressive osseous abnormality.  Unchanged severe compression deformity of the T12 vertebral body.  Redemonstrated multilevel lumbar spine degenerative change, worse at L5 - S1.  IMPRESSION:  1.  Punctate (1-2 mm) stone within the distal aspect of the  right ureter results in mild to moderate upstream ureterectasis and pelvicaliectasis. 2.  Additional bilateral nonobstructing renal stones.  3.  Indeterminate 3-mm noncalcified nodule within the right lower lobe.  If the patient is at high risk for bronchogenic carcinoma, follow-up chest CT at 1 year is recommended.  If the patient is at low risk, no follow-up is needed.  This recommendation follows the consensus statement: Guidelines for Management of Small Pulmonary Nodules Detected on CT Scans:  A Statement from the Fleischner Society as published in Radiology 2005; 237:395-400.  4. Colonic diverticulosis without evidence of diverticulitis. No evidence of enteric obstruction.  Original Report Authenticated By: Waynard Reeds, M.D.     No diagnosis found.    MDM  Constipation, Kidney stone (right ureter)  Pts incidental finding of lung nodule discussed with him, he denies a history of tobacco use and appears to be low rick for bronchogenic carcinoma. Pts crt is WNL, pain treated w Toradol in the ED. Pt does not want narcotics on dc b/c he is "old and lives alone" Dc with naproxen and PCP follow up. Pt afebrile w VSS prior to dc.        Jaci Carrel,  PA-C 02/11/12 1140

## 2012-02-11 NOTE — ED Notes (Signed)
First meeting with patient. Patient states he has had diarrhea x 3 days and now feels like he is constipated and having pain in the RLQ. Patient resting with NAD at this time.

## 2012-02-11 NOTE — Discharge Instructions (Signed)
You have been diagnosed with kidney stones.  Use your pain medication as prescribed and do not operate heavy machinery while on this medication. Note that your pain medication contains Acetaminophen (Tylenol), therefore it is not recommended to take additional Tylenol while on your pain medication. Continue to drink fluids to help you pass the stones.  Use Zofran for nausea as directed.  Followup with your primary care doctor in regards to your hospital visit.  Return to the ED immediately if you develop fever that persists > 101, uncontrolled pain or vomiting, or other concerns.  Read the instructions below to learn more about kidney stones.  If you do not have a primary care doctor to followup with, use the resource guide attached to help you find one.   Kidney Stones Kidney stones (ureteral lithiasis) are solid masses that form inside your kidneys. The intense pain is caused by the stone moving through the kidney, ureter, bladder, and urethra (urinary tract). When the stone moves, the ureter starts to spasm around the stone. The stone is usually passed in the urine.  HOME CARE  Drink enough fluids to keep your pee (urine) clear or pale yellow. This helps to get the stone out.   Only take medicine as told by your doctor.   Follow up with your doctor as told.  GET HELP RIGHT AWAY IF:   Your pain does not get better with medicine.   You have a fever.   Your pain increases and gets worse over 18 hours.   You have new belly (abdominal) pain.   You feel faint or pass out.  MAKE SURE YOU:   Understand these instructions.   Will watch your condition.   Will get help right away if you are not doing well or get worse.   RESOURCE GUIDE  Dental Problems  Patients with Medicaid: [facility]                     [facility] [address]                                           [address] Phone:  [phone]                                                   Phone:  [phone]  If unable to pay or uninsured, contact:  [facility] or [facility] to become qualified for the adult dental clinic.  Chronic Pain Problems Contact [facility]  [phone] Patients need to be referred by their primary care doctor.  Insufficient Money for Medicine Contact [employer]:  call "211" or [facility] 271-5999.  No Primary Care Doctor Call [facility]  832-8000 Other agencies that provide inexpensive medical care    [facility]  [phone]    [facility]  [phone]    [facility]  [phone]    Women[facility] Clinic  [phone]    [facility]  [phone]    [facility]  [phone]  Psychological Services [facility]  [phone] [facility]  [phone] [facility]   [phone] (emergency services 641-4993)    Substance Abuse Resources [facility]  336-882-2125 [facility] 336-784-9470 [facility] 336-285-9073 [facility] 336-845-3988 Residential & Outpatient Substance Abuse Program  800-659-3381  Abuse/Neglect [place] Child Abuse Hotline (336) 641-3795 [place] Child Abuse Hotline 800-378-5315 (After Hours)  Emergency Shelter [facility] (336) 271-5985  [facility] (336) 275-9566 [facility] (704) 372-4663  MRSA Hotline #:   832-7006    [place] Resources  [facility] [place]     [employer]                          [facility] [address] [place]                       [address]      [address]  [place]                                                [place]                            [place] Phone:  [phone]                                   Phone:  [phone]                 Phone:  [phone]  [facility] Phone:   [phone]  [place] Child Abuse Hotline (336) 342-1394 (336) 342-3537 (After Hours)   

## 2012-02-11 NOTE — ED Notes (Signed)
Patient resting and waiting CT results.

## 2012-02-11 NOTE — ED Notes (Signed)
Patient being transported to CT

## 2012-02-12 NOTE — ED Provider Notes (Signed)
Medical screening examination/treatment/procedure(s) were conducted as a shared visit with non-physician practitioner(s) and myself.  I personally evaluated the patient during the encounter  Please see my separate respective documentation pertaining to this patient encounter   Vida Roller, MD 02/12/12 [phone]

## 2012-02-15 ENCOUNTER — Encounter (HOSPITAL_COMMUNITY): Payer: Self-pay | Admitting: Emergency Medicine

## 2012-02-15 ENCOUNTER — Emergency Department (HOSPITAL_COMMUNITY)
Admission: EM | Admit: 2012-02-15 | Discharge: 2012-02-16 | Disposition: A | Discharge status: Home / Self Care | Payer: Medicare Other | Attending: Emergency Medicine | Admitting: Emergency Medicine

## 2012-02-15 DIAGNOSIS — R109 Unspecified abdominal pain: Secondary | ICD-10-CM | POA: Insufficient documentation

## 2012-02-15 DIAGNOSIS — N200 Calculus of kidney: Secondary | ICD-10-CM | POA: Insufficient documentation

## 2012-02-15 DIAGNOSIS — N400 Enlarged prostate without lower urinary tract symptoms: Secondary | ICD-10-CM | POA: Insufficient documentation

## 2012-02-15 DIAGNOSIS — N390 Urinary tract infection, site not specified: Secondary | ICD-10-CM | POA: Insufficient documentation

## 2012-02-15 HISTORY — DX: Calculus of kidney: N20.0

## 2012-02-15 NOTE — ED Notes (Signed)
Pt reports Mark Berger was here for kidney stone.  He lives by himself and [place] out of state; said he was shaking and could not dial phone. He became scared. He says he then 2 took a pain pill an hour and half ago. He does report he is feeling better.

## 2012-02-16 ENCOUNTER — Encounter (HOSPITAL_COMMUNITY): Payer: Self-pay | Admitting: *Deleted

## 2012-02-16 LAB — URINALYSIS, ROUTINE W REFLEX MICROSCOPIC
Hgb urine dipstick: NEGATIVE
Protein, ur: NEGATIVE mg/dL
Urobilinogen, UA: 0.2 mg/dL (ref 0.0–1.0)

## 2012-02-16 LAB — URINE MICROSCOPIC-ADD ON

## 2012-02-16 MED ORDER — TAMSULOSIN HCL 0.4 MG PO CAPS
0.4000 mg | ORAL_CAPSULE | Freq: Once | ORAL | Status: AC
  Administered 2012-02-16: 0.4 mg via ORAL
  Filled 2012-02-16: qty 1

## 2012-02-16 MED ORDER — TAMSULOSIN HCL 0.4 MG PO CAPS: 0.4000 mg | ORAL_CAPSULE | Freq: Every day | ORAL | Status: DC

## 2012-02-16 MED ORDER — CIPROFLOXACIN HCL 500 MG PO TABS: 500.0000 mg | ORAL_TABLET | Freq: Two times a day (BID) | ORAL | Status: AC

## 2012-02-16 MED ORDER — HYDROCODONE-ACETAMINOPHEN 5-325 MG PO TABS
1.0000 | ORAL_TABLET | Freq: Once | ORAL | Status: AC
  Administered 2012-02-16: 1 via ORAL
  Filled 2012-02-16: qty 1

## 2012-02-16 MED ORDER — CIPROFLOXACIN HCL 500 MG PO TABS
500.0000 mg | ORAL_TABLET | Freq: Once | ORAL | Status: AC
  Administered 2012-02-16: 500 mg via ORAL
  Filled 2012-02-16: qty 1

## 2012-02-16 NOTE — ED Notes (Signed)
Pt reports being diagnosed with a kidney stone last Wednesday.  States that he has not been drinking much water.  Reports taking 2 Vicodin PTA.  Reports that he had severe pain at home PTA with shaking.

## 2012-02-16 NOTE — ED Notes (Signed)
Pt given water to drink and a urine strainer.

## 2012-02-16 NOTE — ED Provider Notes (Signed)
History     CSN: [id number]  Arrival date & time 02/15/12  2209   First MD Initiated Contact with Patient 02/16/12 0154      Chief Complaint  Patient presents with  . Nephrolithiasis    (Consider location/radiation/quality/duration/timing/severity/associated sxs/prior treatment) HPI 78 year old male presents emergency department with right flank pain. Patient was seen 4 days ago and diagnosed with a right-sided 1-2 mm kidney stone. Patient was prescribed Norco. He reports he only took one Norco on Wednesday after being discharged from the emergency department, has taken none since. Tonight he had acute onset of severe sharp right flank pain radiating around his abdomen. Patient reports he got sweaty with due to the pain. He took 2 Norco. Patient denies any pain at this time. Patient lives alone and is very anxious about being discharged home by himself as he is afraid that he is going to have this pain again. He has had no fevers no other abdominal pain no dysuria no hematuria. Patient does report that he has had no urinary output in the last 6-8 hours. He has a history of BPH and normally goes every 2 hours. He reports that he has not been drinking as much as he normally does. Past Medical History  Diagnosis Date  . Arthritis   . Trigeminal neuralgia   . Kidney calculi     Past Surgical History  Procedure Date  . Hernia repair   . Hemmor   . Hemorrhoid surgery     History reviewed. No pertinent family history.  History  Substance Use Topics  . Smoking status: Never Smoker   . Smokeless tobacco: Not on file  . Alcohol Use: No      Review of Systems  All other systems reviewed and are negative.    Allergies  Review of patient's allergies indicates no known allergies.  Home Medications   Current Outpatient Rx  Name Route Sig Dispense Refill  . ATORVASTATIN CALCIUM 10 MG PO TABS Oral Take 10 mg by mouth daily.    Marland Kitchen BISACODYL 5 MG PO TBEC Oral Take 5 mg by mouth  daily as needed. constipation    . CALCIUM CARBONATE-VITAMIN D 500-200 MG-UNIT PO TABS Oral Take 1 tablet by mouth at bedtime.    Marland Kitchen HYDROCODONE-ACETAMINOPHEN 5-325 MG PO TABS Oral Take 2 tablets by mouth every 4 (four) hours as needed for pain. 12 tablet 0  . LORAZEPAM 1 MG PO TABS Oral Take 0.5 mg by mouth at bedtime. For sleep    . OMEGA-3-ACID ETHYL ESTERS 1 G PO CAPS Oral Take 1 g by mouth 2 (two) times daily.     Marland Kitchen CIPROFLOXACIN HCL 500 MG PO TABS Oral Take 1 tablet (500 mg total) by mouth every 12 (twelve) hours. 14 tablet 0  . TAMSULOSIN HCL 0.4 MG PO CAPS Oral Take 1 capsule (0.4 mg total) by mouth daily. 30 capsule 0    BP 115/66  Pulse 105  Temp(Src) 98.5 F (36.9 C) (Oral)  Resp 20  SpO2 93%  Physical Exam  Nursing note and vitals reviewed. Constitutional: He is oriented to person, place, and time. He appears well-developed and well-nourished.  HENT:  Head: Normocephalic and atraumatic.  Right Ear: External ear normal.  Left Ear: External ear normal.  Nose: Nose normal.  Mouth/Throat: Oropharynx is clear and moist.  Eyes: Conjunctivae and EOM are normal. Pupils are equal, round, and reactive to light.  Neck: Normal range of motion. Neck supple. No JVD present. No tracheal deviation  present. No thyromegaly present.  Cardiovascular: Normal rate, regular rhythm, normal heart sounds and intact distal pulses.  Exam reveals no gallop and no friction rub.   No murmur heard. Pulmonary/Chest: Effort normal and breath sounds normal. No stridor. No respiratory distress. He has no wheezes. He has no rales. He exhibits no tenderness.  Abdominal: Soft. Bowel sounds are normal. He exhibits no distension and no mass. There is no tenderness. There is no rebound and no guarding.  Musculoskeletal: Normal range of motion. He exhibits no edema and no tenderness.  Lymphadenopathy:    He has no cervical adenopathy.  Neurological: He is oriented to person, place, and time. He exhibits normal  muscle tone. Coordination normal.  Skin: Skin is dry. No rash noted. No erythema. No pallor.  Psychiatric: He has a normal mood and affect. His behavior is normal. Judgment and thought content normal.    ED Course  Procedures (including critical care time) Bedside ultrasound used to assess for urine and bladder. Patient with moderate amount of urine noted in bladder, after patient urinated still with small amount of urine seen on ultrasound. Labs Reviewed  URINALYSIS, ROUTINE W REFLEX MICROSCOPIC - Abnormal; Notable for the following:    APPearance CLOUDY (*)    Ketones, ur 15 (*)    Leukocytes, UA LARGE (*)    All other components within normal limits  URINE MICROSCOPIC-ADD ON - Abnormal; Notable for the following:    Bacteria, UA MANY (*)    All other components within normal limits  URINE CULTURE   No results found.   1. Nephrolithiasis   2. UTI (lower urinary tract infection)   3. BPH (benign prostatic hyperplasia)       MDM  78 year old male with known small kidney stone now with urinary tract infection. Will start patient on scheduled Norco for any further pain, however I feel he most likely has passed the stone. Patient does have urinary tract infection this time, will start on Cipro. He had a similar infection in February. Patient does not appear toxic, no fever chills back pain or vomiting. Strongly recommend patient have a followup with Dr. Wylene Simmer as well as the urology office for further workup        Olivia Mackie, MD 02/16/12 [phone]

## 2012-02-16 NOTE — Discharge Instructions (Signed)
Please take new medications as prescribed. Start taking your Vicodin/hydrocodone tablets one to 2 tablets every 4 hours for the next 2 days to hopefully prevent any further pain from your kidney stone. Please contact the urology office for followup regarding your recent diagnoses of kidney stone, urinary tract infection, and your underlying BPH. Return to the emergency department for fevers chills nausea vomiting worsening pain or other concerning symptoms  Benign Prostatic Hyperplasia You have an enlarged prostate. This is common in elderly males. It is called BPH. This stands for benign prostate hyperplasia. The prostate gland is located in base of the bladder. When it grows, the prostate blocks the urethra. This is the tube which drains urine from the bladder.  SYMPTOMS  Weak urine stream.   Dribbling.   Feeling like the bladder has not emptied completely.   Difficulty starting urination.   Getting up frequently at night to urinate.   Urinating more frequently during the day.  Complete urinary blockage or severe pain with urination requires immediate attention. DIAGNOSIS   Your caregiver often has a good idea what is wrong by taking a history and doing a physical exam.   Special x-rays may be done.  TREATMENT   For mild problems, no treatment may be necessary.   If the problems are moderate, medications may provide relief. Some of these work by making the prostate gland smaller. The herb saw palmetto is commonly used.   If complete blockage occurs, a Foley catheter is usually left in place for a few days.   Surgery is often needed for more severe problems. TURP is the prostate surgery for BPH which is done through the urethra. TURP stands for transurethral resection of the prostate. It involves cutting away chips from the prostate. It is done by removing chips so that they can come out through the penis.   Techniques using heat, microwave and laser to remove the prostate blockage  are also being used.  HOME CARE INSTRUCTIONS   Give yourself time when you urinate.   Stay away from alcohol.   Beverages containing caffeine such as coffee, tea and colas can make the problems worse.   Decongestants, antihistamines, and some prescription medicines can also make the problem worse.   Follow up with your caregiver for further treatment as recommended.  SEEK IMMEDIATE MEDICAL CARE IF:   You develop increased pain with urination or are unable to pass your water.   You develop severe abdominal pain, vomiting, a high fever, or fainting.   You develop back pain or blood in your urine.  MAKE SURE YOU:   Understand these instructions.   Will watch your condition.   Will get help right away if you are not doing well or get worse.  Document Released: 10/19/2005 Document Revised: 10/08/2011 Document Reviewed: 06/24/2007 [facility] Patient Information 2012 [place], [place]. Kidney Stones Kidney stones (ureteral lithiasis) are deposits that form inside your kidneys. The intense pain is caused by the stone moving through the urinary tract. When the stone moves, the ureter goes into spasm around the stone. The stone is usually passed in the urine.  CAUSES   A disorder that makes certain neck glands produce too much parathyroid hormone (primary hyperparathyroidism).   A buildup of uric acid crystals.   Narrowing (stricture) of the ureter.   A kidney obstruction present at birth (congenital obstruction).   Previous surgery on the kidney or ureters.   Numerous kidney infections.  SYMPTOMS   Feeling sick to your stomach (nauseous).  Throwing up (vomiting).   Blood in the urine (hematuria).   Pain that usually spreads (radiates) to the groin.   Frequency or urgency of urination.  DIAGNOSIS   Taking a history and physical exam.   Blood or urine tests.   Computerized X-ray scan (CT scan).   Occasionally, an examination of the inside of the urinary bladder  (cystoscopy) is performed.  TREATMENT   Observation.   Increasing your fluid intake.   Surgery may be needed if you have severe pain or persistent obstruction.  The size, location, and chemical composition are all important variables that will determine the proper choice of action for you. Talk to your caregiver to better understand your situation so that you will minimize the risk of injury to yourself and your kidney.  HOME CARE INSTRUCTIONS   Drink enough water and fluids to keep your urine clear or pale yellow.   Strain all urine through the provided strainer. Keep all particulate matter and stones for your caregiver to see. The stone causing the pain may be as small as a grain of salt. It is very important to use the strainer each and every time you pass your urine. The collection of your stone will allow your caregiver to analyze it and verify that a stone has actually passed.   Only take over-the-counter or prescription medicines for pain, discomfort, or fever as directed by your caregiver.   Make a follow-up appointment with your caregiver as directed.   Get follow-up X-rays if required. The absence of pain does not always mean that the stone has passed. It may have only stopped moving. If the urine remains completely obstructed, it can cause loss of kidney function or even complete destruction of the kidney. It is your responsibility to make sure X-rays and follow-ups are completed. Ultrasounds of the kidney can show blockages and the status of the kidney. Ultrasounds are not associated with any radiation and can be performed easily in a matter of minutes.  SEEK IMMEDIATE MEDICAL CARE IF:   Pain cannot be controlled with the prescribed medicine.   You have a fever.   The severity or intensity of pain increases over 18 hours and is not relieved by pain medicine.   You develop a new onset of abdominal pain.   You feel faint or pass out.  MAKE SURE YOU:   Understand these  instructions.   Will watch your condition.   Will get help right away if you are not doing well or get worse.  Document Released: 10/19/2005 Document Revised: 10/08/2011 Document Reviewed: 02/14/2010 [facility] Patient Information 2012 [place], [place]. Urinary Tract Infection Infections of the urinary tract can start in several places. A bladder infection (cystitis), a kidney infection (pyelonephritis), and a prostate infection (prostatitis) are different types of urinary tract infections (UTIs). They usually get better if treated with medicines (antibiotics) that kill germs. Take all the medicine until it is gone. You or your child may feel better in a few days, but TAKE ALL MEDICINE or the infection may not respond and may become more difficult to treat. HOME CARE INSTRUCTIONS   Drink enough water and fluids to keep the urine clear or pale yellow. Cranberry juice is especially recommended, in addition to large amounts of water.   Avoid caffeine, tea, and carbonated beverages. They tend to irritate the bladder.   Alcohol may irritate the prostate.   Only take over-the-counter or prescription medicines for pain, discomfort, or fever as directed by your caregiver.  To prevent further infections:  Empty the bladder often. Avoid holding urine for long periods of time.   After a bowel movement, women should cleanse from front to back. Use each tissue only once.   Empty the bladder before and after sexual intercourse.  FINDING OUT THE RESULTS OF YOUR TEST Not all test results are available during your visit. If your or your child's test results are not back during the visit, make an appointment with your caregiver to find out the results. Do not assume everything is normal if you have not heard from your caregiver or the medical facility. It is important for you to follow up on all test results. SEEK MEDICAL CARE IF:   There is back pain.   Your baby is older than 3 months with a rectal  temperature of 100.5 F (38.1 C) or higher for more than 1 day.   Your or your child's problems (symptoms) are no better in 3 days. Return sooner if you or your child is getting worse.  SEEK IMMEDIATE MEDICAL CARE IF:   There is severe back pain or lower abdominal pain.   You or your child develops chills.   You have a fever.   Your baby is older than 3 months with a rectal temperature of 102 F (38.9 C) or higher.   Your baby is 9 months old or younger with a rectal temperature of 100.4 F (38 C) or higher.   There is nausea or vomiting.   There is continued burning or discomfort with urination.  MAKE SURE YOU:   Understand these instructions.   Will watch your condition.   Will get help right away if you are not doing well or get worse.  Document Released: 07/29/2005 Document Revised: 10/08/2011 Document Reviewed: 03/03/2007 [facility] Patient Information 2012 [place], [place]. Urine Strainer This strainer is used to catch or filter out any stones found in your urine. Place the strainer under your urine stream. Save any stones or objects that you find in your urine. Place them in a plastic or glass container to show your caregiver. The stones vary in size - some can be very small, so make sure you check the strainer carefully. Your caregiver may send the stone to the lab. When the results are back, your caregiver may recommend medicines or diet changes.  Document Released: 07/24/2004 Document Revised: 10/08/2011 Document Reviewed: 08/31/2008 [facility] Patient Information 2012 [place], [place].

## 2012-02-17 LAB — URINE CULTURE: Colony Count: >=100000

## 2012-02-18 NOTE — ED Notes (Signed)
+   Urine Patient treated with Cipro-sensitive to same.Chart sent to EDP office for review.

## 2012-07-29 ENCOUNTER — Encounter (HOSPITAL_COMMUNITY): Payer: Self-pay

## 2012-08-03 ENCOUNTER — Encounter: Payer: Self-pay | Admitting: Physician Assistant

## 2012-08-03 ENCOUNTER — Other Ambulatory Visit: Payer: Self-pay | Admitting: Physician Assistant

## 2012-08-03 DIAGNOSIS — N200 Calculus of kidney: Secondary | ICD-10-CM

## 2012-08-03 DIAGNOSIS — M1711 Unilateral primary osteoarthritis, right knee: Secondary | ICD-10-CM

## 2012-08-03 DIAGNOSIS — M1990 Unspecified osteoarthritis, unspecified site: Secondary | ICD-10-CM | POA: Insufficient documentation

## 2012-08-03 NOTE — H&P (Signed)
Mark Berger is an 78 y.o. male.   Chief Complaint: right knee DJD HPI: Mark Berger is seen for follow-up from his significant persistent bilateral knee pain right worse than left. He underwent a right knee MRI on 06/25/12 that showed severe patellofemoral degenerative joint disease with moderate degenerative joint disease throughout the rest of the knee and complex medial meniscus tear. His right knee bothers him significantly more than the left. The risks, benefits, and possible complications of the procedure were discussed in detail with the patient.  The patient is without question. The excruciating pain is getting progressively worse, limiting activities of daily living, poses a significant fall risk, and has failed multiple conservative treatments including intraarticular cortisone injections, bracing, medication and home physical therapy.  Past Medical History  Diagnosis Date  . Arthritis   . Trigeminal neuralgia   . Kidney calculi   . Right knee DJD     Past Surgical History  Procedure Date  . Hernia repair   . Hemorrhoid surgery     No family history on file. Social History:  reports that he has never smoked. He does not have any smokeless tobacco history on file. He reports that he does not drink alcohol or use illicit drugs.  Allergies: No Known Allergies Current Outpatient Prescriptions on File Prior to Visit  Medication Sig Dispense Refill  . atorvastatin (LIPITOR) 10 MG tablet Take 10 mg by mouth daily.      . bisacodyl (DULCOLAX) 5 MG EC tablet Take 5 mg by mouth daily as needed. constipation      . cholecalciferol (VITAMIN D) 1000 UNITS tablet Take 1,000 Units by mouth daily.      Marland Kitchen LORazepam (ATIVAN) 1 MG tablet Take 0.5 mg by mouth at bedtime as needed. For sleep      . Multiple Vitamins-Minerals (MULTIVITAMINS THER. W/MINERALS) TABS Take 1 tablet by mouth at bedtime.      . Naphazoline-Glycerin (REDNESS RELIEF OP) Apply 1 drop to eye every other day.      .  omega-3 acid ethyl esters (LOVAZA) 1 G capsule Take 2 g by mouth at bedtime.       . senna (SENOKOT) 8.6 MG TABS Take 1 tablet by mouth at bedtime.      . Tamsulosin HCl (FLOMAX) 0.4 MG CAPS Take 1 capsule (0.4 mg total) by mouth daily.  30 capsule  0    (Not in a hospital admission)  No results found for this or any previous visit (from the past 48 hour(s)). No results found.  Review of Systems  Constitutional: Negative.   HENT: Positive for hearing loss. Negative for ear pain, nosebleeds, congestion, sore throat, tinnitus and ear discharge.   Eyes: Negative.   Respiratory: Negative.  Negative for stridor.   Cardiovascular: Negative.   Gastrointestinal: Positive for constipation. Negative for heartburn, nausea, vomiting, abdominal pain, diarrhea, blood in stool and melena.  Genitourinary: Negative.   Musculoskeletal: Positive for joint pain and falls.  Skin: Negative.   Neurological: Negative.  Negative for headaches.  Endo/Heme/Allergies: Negative.   Psychiatric/Behavioral: Negative.     Blood pressure 154/79, pulse 114, temperature 97.8 F (36.6 C), height 5\' 10"  (1.778 m), weight 90.719 kg (200 lb), SpO2 96.00%. Physical Exam  Constitutional: He appears well-developed and well-nourished.  HENT:  Head: Normocephalic and atraumatic.  Eyes: Conjunctivae normal and EOM are normal. Pupils are equal, round, and reactive to light.  Neck: Normal range of motion. Neck supple.  Cardiovascular: Normal rate and regular rhythm.  Respiratory: Effort normal and breath sounds normal.  GI: Soft. Bowel sounds are normal.  Genitourinary:       Not pertinent to current symptomatology therefore not examined.  Musculoskeletal:       Examination of his right knee reveals mild varus deformity, 1 to 2+ crepitation, 1+ synovitis range of motion -5 to 120 degrees with diffuse pain and normal patella tracking. Exam of the left knee reveals mild varus deformity 1+ crepitation 1+ synovitis range of  motion -5 to 120 degrees knee is stable with normal patella tracking. Vascular exam: pulses 2+ and symmetric.  Neurological: He is alert.  Skin: Skin is warm and dry.    Xrays: AP,LATERAL, FLEXION, and SUNRISE views of the right knee show significant joint space narrowing, with periarticular osteophytes, and subchondral sclerosis.   Assessment Patient Active Problem List  Diagnosis  . Syncope  . Trigeminal neuralgia  . Osteoarthritis of knee  . Urinary tract infection  . Benign prostate hyperplasia  . Chronic low back pain  . Arthritis  . Kidney calculi  . Right knee DJD    Plan I discuss this with him in detail.  Would recommend we proceed with right total knee replacement due to failed conservative care severe pain and bone on bone degenerative joint disease noted on x-rays and MRI. Discussed risks benefits and possible complications of the surgery in detail and he understands this completely. He has been cleared preoperatively by Dr. Wylene Simmer and [facility].   Alexandro Line J 08/03/2012, 2:34 PM

## 2012-08-09 NOTE — Pre-Procedure Instructions (Signed)
[name]  08/09/2012   Your procedure is scheduled on:  Monday, October 14th  Report to [name] at 0645 AM.  Call this number if you have problems the morning of surgery: [phone]   Remember:   Do not eat food or drink:After Midnight.  Take these medicines the morning of surgery with A SIP OF WATER: flomax   Do not wear jewelry, make-up or nail polish.  Do not wear lotions, powders, or perfumes.   Do not shave 48 hours prior to surgery. Men may shave face and neck.  Do not bring valuables to the hospital.  Contacts, dentures or bridgework may not be worn into surgery.  Leave suitcase in the car. After surgery it may be brought to your room.  For patients admitted to the hospital, checkout time is 11:00 AM the day of discharge.   Patients discharged the day of surgery will not be allowed to drive home.   Special Instructions: Shower using CHG 2 nights before surgery and the night before surgery.  If you shower the day of surgery use CHG.  Use special wash - you have one bottle of CHG for all showers.  You should use approximately 1/3 of the bottle for each shower.   Please read over the following fact sheets that you were given: Pain Booklet, Coughing and Deep Breathing, Blood Transfusion Information, MRSA Information and Surgical Site Infection Prevention

## 2012-08-10 ENCOUNTER — Encounter (HOSPITAL_COMMUNITY)
Admission: RE | Admit: 2012-08-10 | Discharge: 2012-08-10 | Disposition: A | Discharge status: Home / Self Care | Payer: Medicare Other | Source: Ambulatory Visit | Attending: Orthopedic Surgery | Admitting: Orthopedic Surgery

## 2012-08-10 ENCOUNTER — Encounter (HOSPITAL_COMMUNITY): Payer: Self-pay

## 2012-08-10 ENCOUNTER — Ambulatory Visit (HOSPITAL_COMMUNITY)
Admission: RE | Admit: 2012-08-10 | Discharge: 2012-08-10 | Disposition: A | Discharge status: Home / Self Care | Payer: Medicare Other | Source: Ambulatory Visit | Attending: Physician Assistant | Admitting: Physician Assistant

## 2012-08-10 DIAGNOSIS — Z01818 Encounter for other preprocedural examination: Secondary | ICD-10-CM | POA: Insufficient documentation

## 2012-08-10 DIAGNOSIS — R911 Solitary pulmonary nodule: Secondary | ICD-10-CM | POA: Insufficient documentation

## 2012-08-10 DIAGNOSIS — Z01812 Encounter for preprocedural laboratory examination: Secondary | ICD-10-CM | POA: Insufficient documentation

## 2012-08-10 HISTORY — DX: Benign prostatic hyperplasia without lower urinary tract symptoms: N40.0

## 2012-08-10 HISTORY — DX: Urinary tract infection, site not specified: N39.0

## 2012-08-10 HISTORY — DX: Anxiety disorder, unspecified: F41.9

## 2012-08-10 HISTORY — DX: Pneumonia, unspecified organism: J18.9

## 2012-08-10 HISTORY — DX: Shortness of breath: R06.02

## 2012-08-10 LAB — TYPE AND SCREEN: ABO/RH(D): A POS

## 2012-08-10 LAB — COMPREHENSIVE METABOLIC PANEL
ALT: 14 U/L (ref 0–53)
Alkaline Phosphatase: 60 U/L (ref 39–117)
CO2: 24 mEq/L (ref 19–32)
Calcium: 9.8 mg/dL (ref 8.4–10.5)
GFR calc Af Amer: >90 mL/min (ref >90)
GFR calc non Af Amer: 79 mL/min — ABNORMAL LOW (ref >90)
Glucose, Bld: 114 mg/dL — ABNORMAL HIGH (ref 70–99)
Sodium: 135 mEq/L (ref 135–145)

## 2012-08-10 LAB — CBC
HCT: 41.3 % (ref 39.0–52.0)
Hemoglobin: 14.6 g/dL (ref 13.0–17.0)
MCV: 93 fL (ref 78.0–100.0)
RBC: 4.44 MIL/uL (ref 4.22–5.81)
WBC: 7.3 10*3/uL (ref 4.0–10.5)

## 2012-08-10 LAB — URINALYSIS, ROUTINE W REFLEX MICROSCOPIC
Glucose, UA: 100 mg/dL — AB
Hgb urine dipstick: NEGATIVE
Specific Gravity, Urine: 1.022 (ref 1.005–1.030)
pH: 5.5 (ref 5.0–8.0)

## 2012-08-10 NOTE — Progress Notes (Signed)
Primary Physician - Dr. Wylene Simmer Cardiologist - Dr. Jacinto Halim [facility] Cardiac EKG in epic and Echo in epic. No stress test or cath

## 2012-08-11 ENCOUNTER — Other Ambulatory Visit: Payer: Self-pay | Admitting: Orthopedic Surgery

## 2012-08-11 ENCOUNTER — Encounter (HOSPITAL_COMMUNITY): Payer: Self-pay | Admitting: Vascular Surgery

## 2012-08-11 ENCOUNTER — Ambulatory Visit (HOSPITAL_COMMUNITY)
Admission: RE | Admit: 2012-08-11 | Discharge: 2012-08-11 | Disposition: A | Discharge status: Home / Self Care | Payer: Medicare Other | Source: Ambulatory Visit | Attending: Orthopedic Surgery | Admitting: Orthopedic Surgery

## 2012-08-11 ENCOUNTER — Ambulatory Visit
Admission: RE | Admit: 2012-08-11 | Discharge: 2012-08-11 | Disposition: A | Discharge status: Home / Self Care | Payer: Medicare Other | Source: Ambulatory Visit | Attending: Orthopedic Surgery | Admitting: Orthopedic Surgery

## 2012-08-11 DIAGNOSIS — N390 Urinary tract infection, site not specified: Secondary | ICD-10-CM | POA: Insufficient documentation

## 2012-08-11 DIAGNOSIS — M1710 Unilateral primary osteoarthritis, unspecified knee: Secondary | ICD-10-CM | POA: Insufficient documentation

## 2012-08-11 DIAGNOSIS — R911 Solitary pulmonary nodule: Secondary | ICD-10-CM

## 2012-08-11 DIAGNOSIS — Z01812 Encounter for preprocedural laboratory examination: Secondary | ICD-10-CM | POA: Insufficient documentation

## 2012-08-11 DIAGNOSIS — A4901 Methicillin susceptible Staphylococcus aureus infection, unspecified site: Secondary | ICD-10-CM | POA: Insufficient documentation

## 2012-08-11 DIAGNOSIS — Z22322 Carrier or suspected carrier of Methicillin resistant Staphylococcus aureus: Secondary | ICD-10-CM

## 2012-08-11 NOTE — Consult Note (Addendum)
Anesthesia Chart Review:  Patient is a 78 year old male scheduled for a right TKA on 08/15/12 by Dr. Thurston Hole.  History includes non-smoker, arthritis, anxiety, BPH, PNA, chronic DOE, kidney stones, trigeminal neuralgia, hernia repair.  PCP is Dr. Wylene Simmer who medically cleared him for this procedure.  He was evaluated by Cardiologist Dr. Jacinto Halim in March 2013 for hospital follow-up for syncope/near syncope in the setting of UTI.   EKG from 07/11/12 (Dr. Wylene Simmer) showed NSR.    He saw Dr. Jacinto Halim on 01/15/12.  He felt patient's syncope was likely neurocardiogenic syncope. He reviewed patient's prior EKG and echocardiogram. Carotid duplex on 01/03/2012 was normal by notes.  Dr. Jacinto Halim recommended PRN cardiology follow-up at that time.  I do not see mention of any prior stress test or cardiac cath.  Echo on 12/20/11 showed: - Left ventricle: The cavity size was normal. There was mild concentric hypertrophy. Systolic function was normal. The estimated ejection fraction was in the range of 55% to 60%. Wall motion was normal; there were no regional wall motion abnormalities. Doppler parameters are consistent with abnormal left ventricular relaxation (grade 1 diastolic dysfunction). - Aortic valve: Trileaflet; mildly thickened leaflets. The Non coronary cusp is thickened and mildly calcified. No stenosis. - Left atrium: The atrium was mildly dilated. - Atrial septum: No defect or patent foramen ovale was identified. - Pulmonary arteries: PA peak pressure: 35mm Hg (S). Impressions: - The right ventricular systolic pressure was increased consistent with mild pulmonary hypertension.  CXR on 08/10/12 showed a 6 mm left upper lobe nodule. CT chest without contrast recommended to further evaluate. Interval progression of compression deformity at T12.   Subsequently, patient had a chest CT on 08/11/12 that showed: 1. No suspicious appearing pulmonary nodule in the left apex to account for the findings on the recent  chest x-ray. This was presumably a concatenation of shadows on the recent chest radiograph.  2. However, there are numerous tiny pulmonary nodules all less than or equal to 4 mm predominately in the lung bases bilaterally, as above. If the patient is at high risk for bronchogenic carcinoma, follow-up chest CT at 1 year is recommended. If the patient is at low risk, no follow-up is needed. This recommendation follows the consensus statement: Guidelines for Management of Small Pulmonary Nodules Detected on CT Scans: A Statement from the Fleischner Society as published in Radiology 2005; 237:395-400.  3. Mild subpleural reticulation throughout the lung bases bilaterally could suggest early interstitial lung disease, or could relate to mild scarring from prior episodes of infection.  4. Atherosclerosis, including left main and three-vessel coronary artery disease.  5. Additional incidental findings, as above.  Labs noted.  There was some glucose in his urine, although history does not indicate that he has a history of DM.  Serum glucose was 114.  His urine culture was sent today, so results are pending.  I reviewed above with Anesthesiologist Dr. Jean Rosenthal.  With finding of left main and 3V coronary atherosclerosis, anesthesia recommends further cardiology input to determine if additional cardiac testing is warranted prior to this procedure.  I notified Kirstin Shepperson, PA-C.  She will contact Dr. Jacinto Halim.    Shonna Chock, PA-C 08/11/12 [id number]

## 2012-08-12 ENCOUNTER — Ambulatory Visit (HOSPITAL_COMMUNITY): Payer: Medicare Other

## 2012-08-12 NOTE — Progress Notes (Signed)
I spoke with Dr Jacinto Halim.  He has recommended further cardiac work up on this patient prior to undergoing anesthesia.  Surgery Cancelled.  Jaymz Traywick A. Gwinda Passe Physician Assistant [facility] [phone]  08/12/2012, 9:32 AM

## 2012-08-12 NOTE — Progress Notes (Signed)
I spoke with Shonna Chock PA-C last night regarding his Chest CT findings of left main and 3 vessel coronary atherosclerosis.  This morning I spoke with Carollee Herter, Dr Deneen Harts nurse, with regards to these findings.  Dr Wylene Simmer is this patients primary care physician who had cleared him for this surgery from a medical and cardiac standpoint.  Carollee Herter told me Dr Wylene Simmer would look into this and get back to me.  At this point in time, this patient is not cleared for his surgery on Monday.  Kellyn Mccary A. Gwinda Passe Physician Assistant [facility] [phone]  08/12/2012, 9:01 AM

## 2012-08-13 LAB — URINE CULTURE: Colony Count: 75000

## 2012-08-15 ENCOUNTER — Inpatient Hospital Stay (HOSPITAL_COMMUNITY): Admission: RE | Admit: 2012-08-15 | Payer: Medicare Other | Source: Ambulatory Visit | Admitting: Orthopedic Surgery

## 2012-08-15 ENCOUNTER — Other Ambulatory Visit: Payer: Self-pay

## 2012-08-15 ENCOUNTER — Encounter (HOSPITAL_COMMUNITY): Payer: Self-pay | Admitting: Emergency Medicine

## 2012-08-15 ENCOUNTER — Emergency Department (HOSPITAL_COMMUNITY): Payer: Medicare Other

## 2012-08-15 ENCOUNTER — Emergency Department (HOSPITAL_COMMUNITY)
Admission: EM | Admit: 2012-08-15 | Discharge: 2012-08-15 | Disposition: A | Discharge status: Home / Self Care | Payer: Medicare Other | Attending: Emergency Medicine | Admitting: Emergency Medicine

## 2012-08-15 DIAGNOSIS — R0989 Other specified symptoms and signs involving the circulatory and respiratory systems: Secondary | ICD-10-CM | POA: Insufficient documentation

## 2012-08-15 DIAGNOSIS — R0602 Shortness of breath: Secondary | ICD-10-CM | POA: Insufficient documentation

## 2012-08-15 DIAGNOSIS — R0609 Other forms of dyspnea: Secondary | ICD-10-CM | POA: Insufficient documentation

## 2012-08-15 DIAGNOSIS — R0600 Dyspnea, unspecified: Secondary | ICD-10-CM

## 2012-08-15 DIAGNOSIS — I2510 Atherosclerotic heart disease of native coronary artery without angina pectoris: Secondary | ICD-10-CM | POA: Insufficient documentation

## 2012-08-15 LAB — CBC WITH DIFFERENTIAL/PLATELET
Basophils Absolute: 0 10*3/uL (ref 0.0–0.1)
Basophils Relative: 0 % (ref 0–1)
Eosinophils Absolute: 0.1 10*3/uL (ref 0.0–0.7)
HCT: 41.1 % (ref 39.0–52.0)
MCH: 32.7 pg (ref 26.0–34.0)
MCHC: 34.8 g/dL (ref 30.0–36.0)
Monocytes Absolute: 0.6 10*3/uL (ref 0.1–1.0)
Neutro Abs: 3.7 10*3/uL (ref 1.7–7.7)
Neutrophils Relative %: 63 % (ref 43–77)
RDW: 13.4 % (ref 11.5–15.5)

## 2012-08-15 LAB — BASIC METABOLIC PANEL
BUN: 12 mg/dL (ref 6–23)
Creatinine, Ser: 0.76 mg/dL (ref 0.50–1.35)
GFR calc Af Amer: >90 mL/min (ref >90)
GFR calc non Af Amer: 80 mL/min — ABNORMAL LOW (ref >90)
Glucose, Bld: 143 mg/dL — ABNORMAL HIGH (ref 70–99)

## 2012-08-15 LAB — POCT I-STAT TROPONIN I

## 2012-08-15 SURGERY — ARTHROPLASTY, KNEE, TOTAL
Anesthesia: General | Laterality: Right

## 2012-08-15 NOTE — ED Notes (Signed)
Pt sts had chest CT on Friday as preop for knee replacement; pt told that he has "hardened arteries" and they cancelled sx; pt sts started having SOB today; pt denies any other complaint

## 2012-08-15 NOTE — ED Provider Notes (Signed)
History     CSN: [id number]  Arrival date & time 08/15/12  1045   First MD Initiated Contact with Patient 08/15/12 1305      Chief Complaint  Patient presents with  . Shortness of Breath    (Consider location/radiation/quality/duration/timing/severity/associated sxs/prior treatment) HPI Pt with no known cardiac disease had CXR and then CT as part of a pre-op evaluation for knee surgery. CXR showed a nodule, so sent for CT which did not redemonstate the nodule but showed coronary atherosclerosis. His surgery was post-poned and he was advised to followup with cardiology for clearance. He is scheduled to see. Dr. Jacinto Halim next week. Today he had a brief episode of SOB, no CP, symptoms were self-limited and resolved prior to my evaluation. He denies any leg swelling or weight gain. Denies any cough or fever.   Past Medical History  Diagnosis Date  . Arthritis   . Trigeminal neuralgia   . Kidney calculi   . Right knee DJD   . Shortness of breath     exertion  . Anxiety   . Pneumonia     history of x3  . Urinary tract infection   . BPH (benign prostatic hyperplasia)     Past Surgical History  Procedure Date  . Hernia repair   . Hemorrhoid surgery   . Eye surgery     bilateral    History reviewed. No pertinent family history.  History  Substance Use Topics  . Smoking status: Never Smoker   . Smokeless tobacco: Not on file  . Alcohol Use: No      Review of Systems All other systems reviewed and are negative except as noted in HPI.   Allergies  Review of patient's allergies indicates no known allergies.  Home Medications   Current Outpatient Rx  Name Route Sig Dispense Refill  . ATORVASTATIN CALCIUM 10 MG PO TABS Oral Take 10 mg by mouth daily.    Marland Kitchen BISACODYL 5 MG PO TBEC Oral Take 5 mg by mouth daily as needed. constipation    . VITAMIN D 1000 UNITS PO TABS Oral Take 1,000 Units by mouth daily.    Marland Kitchen CIPROFLOXACIN HCL 500 MG PO TABS Oral Take 500 mg by mouth 2  (two) times daily. 10 day course; started 08-08-12    . LORAZEPAM 1 MG PO TABS Oral Take 0.5 mg by mouth at bedtime as needed. For sleep    . THERA M PLUS PO TABS Oral Take 1 tablet by mouth at bedtime.    Marland Kitchen REDNESS RELIEF OP Ophthalmic Apply 1 drop to eye every other day.    . OMEGA-3-ACID ETHYL ESTERS 1 G PO CAPS Oral Take 2 g by mouth at bedtime.     . SENNA 8.6 MG PO TABS Oral Take 1 tablet by mouth at bedtime.    . TAMSULOSIN HCL 0.4 MG PO CAPS Oral Take 1 capsule (0.4 mg total) by mouth daily. 30 capsule 0    BP 144/70  Pulse 87  Temp 98 F (36.7 C) (Oral)  Resp 18  SpO2 97%  Physical Exam  Nursing note and vitals reviewed. Constitutional: He is oriented to person, place, and time. He appears well-developed and well-nourished.  HENT:  Head: Normocephalic and atraumatic.  Eyes: EOM are normal. Pupils are equal, round, and reactive to light.  Neck: Normal range of motion. Neck supple.  Cardiovascular: Normal rate, normal heart sounds and intact distal pulses.   Pulmonary/Chest: Effort normal and breath sounds normal.  Abdominal:  Bowel sounds are normal. He exhibits no distension. There is no tenderness.  Musculoskeletal: Normal range of motion. He exhibits no edema and no tenderness.  Neurological: He is alert and oriented to person, place, and time. He has normal strength. No cranial nerve deficit or sensory deficit.  Skin: Skin is warm and dry. No rash noted.  Psychiatric: He has a normal mood and affect.    ED Course  Procedures (including critical care time)  Labs Reviewed  BASIC METABOLIC PANEL - Abnormal; Notable for the following:    Sodium 134 (*)     Glucose, Bld 143 (*)     GFR calc non Af Amer 80 (*)     All other components within normal limits  CBC WITH DIFFERENTIAL  PRO B NATRIURETIC PEPTIDE  POCT I-STAT TROPONIN I   Dg Chest 2 View  08/15/2012  *RADIOLOGY REPORT*  Clinical Data: Shortness of breath since this morning.  Nonsmoker. Coronary  atherosclerosis.  History of pneumonia in the past.  CHEST - 2 VIEW  Comparison: 08/10/2012 chest x-ray 08/11/2012 chest CT  Findings: Heart size is upper limits normal.  There are mild perihilar bronchitic changes.  There are no focal consolidations or pleural effusions.  Atelectasis or scar is stable of the left lung base.  Degenerative changes are seen in the spine.  Wedge compression fracture is identified at T12. Superior endplate fracture is identified at T6.  These fractures appear stable.  IMPRESSION:  1.  Left lower lobe atelectasis or scarring. 2.  Chronic vertebral fractures at T6 and T12. 3.  Multiple small pulmonary nodules have been identified by CT. CT followup has been recommended if the patient is at high risk for bronchogenic carcinoma.   Original Report Authenticated By: Patterson Hammersmith, M.D.      No diagnosis found.    MDM   Date: 08/15/2012  Rate: 91  Rhythm: normal sinus rhythm  QRS Axis: normal  Intervals: normal  ST/T Wave abnormalities: normal  Conduction Disutrbances:none  Narrative Interpretation:   Old EKG Reviewed: unchanged  Labs unremarkable now, symptoms resolved. I discussed with Dr. Jacinto Halim who will call the patient to schedule an outpatient stress test. Pt amenable to this plan, does not want to be admitted. Advised to return for any other concerns.        Charles B. Bernette Mayers, MD 08/15/12 1434

## 2012-08-15 NOTE — ED Notes (Signed)
Pt ambulated to and from bathroom without assistance and did not experience any increase in shortness of breath

## 2012-08-15 NOTE — ED Notes (Signed)
Patient transported to X-ray 

## 2012-09-06 ENCOUNTER — Encounter (HOSPITAL_COMMUNITY): Payer: Self-pay | Admitting: Pharmacy Technician

## 2012-09-07 ENCOUNTER — Other Ambulatory Visit: Payer: Self-pay | Admitting: Physician Assistant

## 2012-09-07 DIAGNOSIS — G500 Trigeminal neuralgia: Secondary | ICD-10-CM

## 2012-09-07 DIAGNOSIS — N400 Enlarged prostate without lower urinary tract symptoms: Secondary | ICD-10-CM | POA: Insufficient documentation

## 2012-09-07 DIAGNOSIS — R0602 Shortness of breath: Secondary | ICD-10-CM | POA: Insufficient documentation

## 2012-09-07 DIAGNOSIS — F419 Anxiety disorder, unspecified: Secondary | ICD-10-CM

## 2012-09-07 NOTE — H&P (Signed)
TOTAL KNEE ADMISSION H&P  Patient is being admitted for right total knee arthroplasty.  Subjective:  Chief Complaint:right knee pain.  HPI: Mark Berger, 78 y.o. male, has a history of pain and functional disability in the right knee due to arthritis and has failed non-surgical conservative treatments for greater than 12 weeks to includeNSAID's and/or analgesics, corticosteriod injections, flexibility and strengthening excercises, supervised PT with diminished ADL's post treatment, use of assistive devices, weight reduction as appropriate and activity modification.  Onset of symptoms was gradual, starting 8 years ago with gradually worsening course since that time. The patient noted no past surgery on the right knee(s).  Patient currently rates pain in the right knee(s) at 7 out of 10 with activity. Patient has night pain, worsening of pain with activity and weight bearing, pain that interferes with activities of daily living, pain with passive range of motion, crepitus and joint swelling.  Patient has evidence of subchondral sclerosis, periarticular osteophytes and joint subluxation by imaging studies. There is no active infection.  Patient Active Problem List   Diagnosis Date Noted  . Shortness of breath   . Anxiety   . BPH (benign prostatic hyperplasia)   . Arthritis   . Kidney calculi   . Right knee DJD   . Syncope 12/19/2011    Class: Acute  . Trigeminal neuralgia 12/19/2011  . Osteoarthritis of knee 12/19/2011  . Urinary tract infection 12/19/2011    Class: Acute  . Benign prostate hyperplasia 12/19/2011  . Chronic low back pain 12/19/2011    Class: Chronic   Past Medical History  Diagnosis Date  . Arthritis   . Trigeminal neuralgia   . Kidney calculi   . Right knee DJD   . Shortness of breath     exertion  . Anxiety   . Pneumonia     history of x3  . Urinary tract infection   . BPH (benign prostatic hyperplasia)     Past Surgical History  Procedure Date  .  Hernia repair   . Hemorrhoid surgery   . Eye surgery     bilateral     (Not in a hospital admission) No Known Allergies   Current Outpatient Prescriptions on File Prior to Visit  Medication Sig Dispense Refill  . atorvastatin (LIPITOR) 10 MG tablet Take 10 mg by mouth daily.      . bisacodyl (DULCOLAX) 5 MG EC tablet Take 5 mg by mouth daily as needed. constipation      . cholecalciferol (VITAMIN D) 1000 UNITS tablet Take 1,000 Units by mouth daily.      Marland Kitchen LORazepam (ATIVAN) 1 MG tablet Take 0.5 mg by mouth at bedtime as needed. For sleep      . Multiple Vitamins-Minerals (MULTIVITAMINS THER. W/MINERALS) TABS Take 1 tablet by mouth at bedtime.      . Naphazoline-Glycerin (REDNESS RELIEF OP) Apply 1 drop to eye every other day.      . omega-3 acid ethyl esters (LOVAZA) 1 G capsule Take 2 g by mouth at bedtime.       . senna (SENOKOT) 8.6 MG TABS Take 1 tablet by mouth at bedtime.      . Tamsulosin HCl (FLOMAX) 0.4 MG CAPS Take 1 capsule (0.4 mg total) by mouth daily.  30 capsule  0    History  Substance Use Topics  . Smoking status: Never Smoker   . Smokeless tobacco: Not on file  . Alcohol Use: No    No family history on file.  Review of Systems  Constitutional: Negative.   HENT: Negative.   Eyes: Negative.   Respiratory: Negative.   Cardiovascular: Negative.   Gastrointestinal: Negative.   Genitourinary: Negative.   Musculoskeletal: Positive for back pain and joint pain.       Right knee  Skin: Negative.   Neurological: Negative.   Endo/Heme/Allergies: Negative.   Psychiatric/Behavioral: Negative.     Objective:  Physical Exam  Constitutional: He is oriented to person, place, and time. He appears well-developed and well-nourished.  Musculoskeletal: He exhibits edema and tenderness.       Examination of his right knee reveals mild varus deformity, 1 to 2+ crepitation, 1+ synovitis range of motion -5 to 120 degrees with diffuse pain and normal patella tracking. Exam  of the left knee reveals mild varus deformity 1+ crepitation 1+ synovitis range of motion -5 to 120 degrees knee is stable with normal patella tracking. Vascular exam: pulses 2+ and symmetric.  Neurological: He is alert and oriented to person, place, and time.  Skin: Skin is warm and dry.  Psychiatric: He has a normal mood and affect. His behavior is normal. Thought content normal.    Vital signs in last 24 hours: Last recorded: 11/06 1300   BP: 166/73 Pulse: 97  Temp: 97.8 F (36.6 C)    Height: 5\' 8"  (1.727 m) SpO2: 94  Weight: 93.895 kg (207 lb)     Labs:   Estimated Body mass index is 31.47 kg/(m^2) as calculated from the following:   Height as of this encounter: 5\' 8" (1.727 m).   Weight as of this encounter: 207 lb(93.895 kg).   Imaging Review Plain radiographs demonstrate severe degenerative joint disease of the right knee(s). The overall alignment issignificant varus. The bone quality appears to be good for age and reported activity level.  Assessment/Plan:  End stage arthritis, right knee   The patient history, physical examination, clinical judgment of the provider and imaging studies are consistent with end stage degenerative joint disease of the right knee(s) and total knee arthroplasty is deemed medically necessary. The treatment options including medical management, injection therapy arthroscopy and arthroplasty were discussed at length. The risks and benefits of total knee arthroplasty were presented and reviewed. The risks due to aseptic loosening, infection, stiffness, patella tracking problems, thromboembolic complications and other imponderables were discussed. The patient acknowledged the explanation, agreed to proceed with the plan and consent was signed. Patient is being admitted for inpatient treatment for surgery, pain control, PT, OT, prophylactic antibiotics, VTE prophylaxis, progressive ambulation and ADL's and discharge planning. The patient is planning to  be discharged to skilled nursing facility

## 2012-09-13 ENCOUNTER — Encounter (HOSPITAL_COMMUNITY)
Admission: RE | Admit: 2012-09-13 | Discharge: 2012-09-13 | Disposition: A | Discharge status: Home / Self Care | Payer: Medicare Other | Source: Ambulatory Visit | Attending: Orthopedic Surgery | Admitting: Orthopedic Surgery

## 2012-09-13 ENCOUNTER — Encounter (HOSPITAL_COMMUNITY): Payer: Self-pay

## 2012-09-13 LAB — URINALYSIS, ROUTINE W REFLEX MICROSCOPIC
Ketones, ur: NEGATIVE mg/dL
Leukocytes, UA: NEGATIVE
Nitrite: NEGATIVE
Specific Gravity, Urine: 1.017 (ref 1.005–1.030)
Urobilinogen, UA: 0.2 mg/dL (ref 0.0–1.0)
pH: 6 (ref 5.0–8.0)

## 2012-09-13 LAB — COMPREHENSIVE METABOLIC PANEL
AST: 16 U/L (ref 0–37)
Albumin: 3.6 g/dL (ref 3.5–5.2)
Alkaline Phosphatase: 67 U/L (ref 39–117)
Chloride: 102 mEq/L (ref 96–112)
Potassium: 4.8 mEq/L (ref 3.5–5.1)
Sodium: 138 mEq/L (ref 135–145)
Total Bilirubin: 0.5 mg/dL (ref 0.3–1.2)

## 2012-09-13 LAB — SURGICAL PCR SCREEN: Staphylococcus aureus: NEGATIVE

## 2012-09-13 LAB — TYPE AND SCREEN

## 2012-09-13 LAB — CBC WITH DIFFERENTIAL/PLATELET
Basophils Absolute: 0.1 10*3/uL (ref 0.0–0.1)
Basophils Relative: 1 % (ref 0–1)
Hemoglobin: 14.9 g/dL (ref 13.0–17.0)
MCHC: 34.6 g/dL (ref 30.0–36.0)
Neutro Abs: 3.6 10*3/uL (ref 1.7–7.7)
Neutrophils Relative %: 54 % (ref 43–77)
RDW: 12.8 % (ref 11.5–15.5)

## 2012-09-13 LAB — APTT: aPTT: 30 seconds (ref 24–37)

## 2012-09-13 NOTE — Pre-Procedure Instructions (Signed)
[name]  09/13/2012   Your procedure is scheduled on:  Monday, November 18th  Report to [name] at 0645 AM.  Call this number if you have problems the morning of surgery: [phone]   Remember:   Do not eat food or drink:After Midnight.   Take these medicines the morning of surgery with A SIP OF WATER: flomax   Do not wear jewelry, make-up or nail polish.  Do not wear lotions, powders, or perfumes.   Do not shave 48 hours prior to surgery. Men may shave face and neck.  Do not bring valuables to the hospital.  Contacts, dentures or bridgework may not be worn into surgery.  Leave suitcase in the car. After surgery it may be brought to your room.  For patients admitted to the hospital, checkout time is 11:00 AM the day of discharge.   Patients discharged the day of surgery will not be allowed to drive home.   Special Instructions: Shower using CHG 2 nights before surgery and the night before surgery.  If you shower the day of surgery use CHG.  Use special wash - you have one bottle of CHG for all showers.  You should use approximately 1/3 of the bottle for each shower.   Please read over the following fact sheets that you were given: Pain Booklet, Coughing and Deep Breathing, Blood Transfusion Information, MRSA Information and Surgical Site Infection Prevention

## 2012-09-13 NOTE — Progress Notes (Signed)
Primary Physician - Dr. Wylene Simmer Cardiologist - Dr. Jacinto Halim EKG, Stress Test 2 weeks ago - will request records.

## 2012-09-18 MED ORDER — CHLORHEXIDINE GLUCONATE 4 % EX LIQD: 60.0000 mL | Freq: Once | CUTANEOUS | Status: DC

## 2012-09-18 MED ORDER — LACTATED RINGERS IV SOLN: INTRAVENOUS | Status: DC

## 2012-09-18 MED ORDER — CEFAZOLIN SODIUM-DEXTROSE 2-3 GM-% IV SOLR
2.0000 g | INTRAVENOUS | Status: AC
  Administered 2012-09-19: 2 g via INTRAVENOUS

## 2012-09-18 MED ORDER — POVIDONE-IODINE 7.5 % EX SOLN
Freq: Once | CUTANEOUS | Status: DC
  Filled 2012-09-18: qty 118

## 2012-09-19 ENCOUNTER — Inpatient Hospital Stay (HOSPITAL_COMMUNITY): Payer: Medicare Other | Admitting: Anesthesiology

## 2012-09-19 ENCOUNTER — Inpatient Hospital Stay (HOSPITAL_COMMUNITY)
Admission: RE | Admit: 2012-09-19 | Discharge: 2012-09-22 | DRG: 470 | Disposition: A | Discharge status: Skilled Nursing Facility | Payer: Medicare Other | Source: Ambulatory Visit | Attending: Orthopedic Surgery | Admitting: Orthopedic Surgery

## 2012-09-19 ENCOUNTER — Encounter (HOSPITAL_COMMUNITY)
Admission: RE | Disposition: A | Discharge status: Skilled Nursing Facility | Payer: Self-pay | Source: Ambulatory Visit | Attending: Orthopedic Surgery

## 2012-09-19 ENCOUNTER — Encounter (HOSPITAL_COMMUNITY): Payer: Self-pay | Admitting: Anesthesiology

## 2012-09-19 DIAGNOSIS — F411 Generalized anxiety disorder: Secondary | ICD-10-CM | POA: Diagnosis present

## 2012-09-19 DIAGNOSIS — M179 Osteoarthritis of knee, unspecified: Secondary | ICD-10-CM | POA: Diagnosis present

## 2012-09-19 DIAGNOSIS — N390 Urinary tract infection, site not specified: Secondary | ICD-10-CM | POA: Diagnosis present

## 2012-09-19 DIAGNOSIS — F419 Anxiety disorder, unspecified: Secondary | ICD-10-CM | POA: Diagnosis present

## 2012-09-19 DIAGNOSIS — L02429 Furuncle of limb, unspecified: Secondary | ICD-10-CM | POA: Diagnosis present

## 2012-09-19 DIAGNOSIS — Z79899 Other long term (current) drug therapy: Secondary | ICD-10-CM

## 2012-09-19 DIAGNOSIS — Z7901 Long term (current) use of anticoagulants: Secondary | ICD-10-CM

## 2012-09-19 DIAGNOSIS — Z01812 Encounter for preprocedural laboratory examination: Secondary | ICD-10-CM

## 2012-09-19 DIAGNOSIS — M1711 Unilateral primary osteoarthritis, right knee: Secondary | ICD-10-CM | POA: Diagnosis present

## 2012-09-19 DIAGNOSIS — M1990 Unspecified osteoarthritis, unspecified site: Secondary | ICD-10-CM | POA: Diagnosis present

## 2012-09-19 DIAGNOSIS — G500 Trigeminal neuralgia: Secondary | ICD-10-CM | POA: Diagnosis present

## 2012-09-19 DIAGNOSIS — Z22322 Carrier or suspected carrier of Methicillin resistant Staphylococcus aureus: Secondary | ICD-10-CM

## 2012-09-19 DIAGNOSIS — M549 Dorsalgia, unspecified: Secondary | ICD-10-CM | POA: Diagnosis present

## 2012-09-19 DIAGNOSIS — R55 Syncope and collapse: Secondary | ICD-10-CM | POA: Diagnosis present

## 2012-09-19 DIAGNOSIS — M1710 Unilateral primary osteoarthritis, unspecified knee: Principal | ICD-10-CM | POA: Diagnosis present

## 2012-09-19 DIAGNOSIS — N400 Enlarged prostate without lower urinary tract symptoms: Secondary | ICD-10-CM | POA: Diagnosis present

## 2012-09-19 DIAGNOSIS — G8929 Other chronic pain: Secondary | ICD-10-CM | POA: Diagnosis present

## 2012-09-19 HISTORY — PX: TOTAL KNEE ARTHROPLASTY: SHX125

## 2012-09-19 SURGERY — ARTHROPLASTY, KNEE, TOTAL
Anesthesia: General | Site: Knee | Laterality: Right | Wound class: Clean

## 2012-09-19 MED ORDER — HYDROMORPHONE HCL PF 1 MG/ML IJ SOLN
0.5000 mg | INTRAMUSCULAR | Status: DC | PRN
  Administered 2012-09-20: 1 mg via INTRAVENOUS
  Filled 2012-09-19: qty 1

## 2012-09-19 MED ORDER — BUPIVACAINE-EPINEPHRINE 0.25% -1:200000 IJ SOLN
INTRAMUSCULAR | Status: DC | PRN
  Administered 2012-09-19: 30 mL

## 2012-09-19 MED ORDER — NAPHAZOLINE HCL 0.1 % OP SOLN
1.0000 [drp] | OPHTHALMIC | Status: DC
  Filled 2012-09-19: qty 15

## 2012-09-19 MED ORDER — VANCOMYCIN HCL IN DEXTROSE 1-5 GM/200ML-% IV SOLN
INTRAVENOUS | Status: AC
  Filled 2012-09-19: qty 200

## 2012-09-19 MED ORDER — ACETAMINOPHEN 10 MG/ML IV SOLN
INTRAVENOUS | Status: AC
  Administered 2012-09-19: 1000 mg via INTRAVENOUS
  Filled 2012-09-19: qty 100

## 2012-09-19 MED ORDER — ONDANSETRON HCL 4 MG PO TABS: 4.0000 mg | ORAL_TABLET | Freq: Four times a day (QID) | ORAL | Status: DC | PRN

## 2012-09-19 MED ORDER — MENTHOL 3 MG MT LOZG
1.0000 | LOZENGE | OROMUCOSAL | Status: DC | PRN
  Administered 2012-09-20: 3 mg via ORAL
  Filled 2012-09-19 (×2): qty 9

## 2012-09-19 MED ORDER — LORAZEPAM 0.5 MG PO TABS
0.5000 mg | ORAL_TABLET | Freq: Four times a day (QID) | ORAL | Status: DC | PRN
  Administered 2012-09-19 – 2012-09-20 (×4): 0.5 mg via ORAL
  Filled 2012-09-19 (×4): qty 1

## 2012-09-19 MED ORDER — ACETAMINOPHEN 10 MG/ML IV SOLN
1000.0000 mg | Freq: Four times a day (QID) | INTRAVENOUS | Status: AC
  Administered 2012-09-19 – 2012-09-20 (×4): 1000 mg via INTRAVENOUS
  Filled 2012-09-19 (×3): qty 100

## 2012-09-19 MED ORDER — CEFUROXIME SODIUM 1.5 G IJ SOLR
INTRAMUSCULAR | Status: DC | PRN
  Administered 2012-09-19: 1.5 g

## 2012-09-19 MED ORDER — VITAMIN D3 25 MCG (1000 UNIT) PO TABS
1000.0000 [IU] | ORAL_TABLET | Freq: Every day | ORAL | Status: DC
  Administered 2012-09-19 – 2012-09-22 (×4): 1000 [IU] via ORAL
  Filled 2012-09-19 (×5): qty 1

## 2012-09-19 MED ORDER — FENTANYL CITRATE 0.05 MG/ML IJ SOLN
INTRAMUSCULAR | Status: DC | PRN
  Administered 2012-09-19: 100 ug via INTRAVENOUS
  Administered 2012-09-19 (×2): 50 ug via INTRAVENOUS
  Administered 2012-09-19: 100 ug via INTRAVENOUS

## 2012-09-19 MED ORDER — CHLORHEXIDINE GLUCONATE 4 % EX LIQD: 60.0000 mL | Freq: Once | CUTANEOUS | Status: DC

## 2012-09-19 MED ORDER — HYDROMORPHONE HCL PF 1 MG/ML IJ SOLN
0.2500 mg | INTRAMUSCULAR | Status: DC | PRN
  Administered 2012-09-19 (×4): 0.5 mg via INTRAVENOUS

## 2012-09-19 MED ORDER — POTASSIUM CHLORIDE IN NACL 20-0.9 MEQ/L-% IV SOLN
INTRAVENOUS | Status: DC
  Administered 2012-09-19: 15:00:00 via INTRAVENOUS
  Administered 2012-09-20: 100 mL/h via INTRAVENOUS
  Filled 2012-09-19 (×9): qty 1000

## 2012-09-19 MED ORDER — VANCOMYCIN HCL IN DEXTROSE 1-5 GM/200ML-% IV SOLN
1000.0000 mg | Freq: Two times a day (BID) | INTRAVENOUS | Status: DC
  Administered 2012-09-19 – 2012-09-22 (×6): 1000 mg via INTRAVENOUS
  Filled 2012-09-19 (×8): qty 200

## 2012-09-19 MED ORDER — VANCOMYCIN HCL IN DEXTROSE 1-5 GM/200ML-% IV SOLN
1000.0000 mg | Freq: Two times a day (BID) | INTRAVENOUS | Status: DC
  Filled 2012-09-19: qty 200

## 2012-09-19 MED ORDER — ACETAMINOPHEN 650 MG RE SUPP: 650.0000 mg | Freq: Four times a day (QID) | RECTAL | Status: DC | PRN

## 2012-09-19 MED ORDER — LACTATED RINGERS IV SOLN
INTRAVENOUS | Status: DC
  Administered 2012-09-19: 09:00:00 via INTRAVENOUS

## 2012-09-19 MED ORDER — PROPOFOL 10 MG/ML IV BOLUS
INTRAVENOUS | Status: DC | PRN
  Administered 2012-09-19: 200 mg via INTRAVENOUS

## 2012-09-19 MED ORDER — MIDAZOLAM HCL 5 MG/5ML IJ SOLN
INTRAMUSCULAR | Status: DC | PRN
  Administered 2012-09-19: 1 mg via INTRAVENOUS

## 2012-09-19 MED ORDER — ACETAMINOPHEN 325 MG PO TABS: 650.0000 mg | ORAL_TABLET | Freq: Four times a day (QID) | ORAL | Status: DC | PRN

## 2012-09-19 MED ORDER — THERA M PLUS PO TABS: 1.0000 | ORAL_TABLET | Freq: Every day | ORAL | Status: DC

## 2012-09-19 MED ORDER — PHENOL 1.4 % MT LIQD: 1.0000 | OROMUCOSAL | Status: DC | PRN

## 2012-09-19 MED ORDER — METOCLOPRAMIDE HCL 5 MG/ML IJ SOLN: 5.0000 mg | Freq: Three times a day (TID) | INTRAMUSCULAR | Status: DC | PRN

## 2012-09-19 MED ORDER — DOCUSATE SODIUM 100 MG PO CAPS
100.0000 mg | ORAL_CAPSULE | Freq: Two times a day (BID) | ORAL | Status: DC
  Administered 2012-09-19 – 2012-09-22 (×6): 100 mg via ORAL
  Filled 2012-09-19 (×7): qty 1

## 2012-09-19 MED ORDER — NAPHAZOLINE-GLYCERIN 0.012-0.25 % OP SOLN: 1.0000 [drp] | OPHTHALMIC | Status: DC

## 2012-09-19 MED ORDER — ENOXAPARIN SODIUM 30 MG/0.3ML ~~LOC~~ SOLN
30.0000 mg | Freq: Two times a day (BID) | SUBCUTANEOUS | Status: DC
  Administered 2012-09-20 – 2012-09-22 (×5): 30 mg via SUBCUTANEOUS
  Filled 2012-09-19 (×7): qty 0.3

## 2012-09-19 MED ORDER — POVIDONE-IODINE 7.5 % EX SOLN: Freq: Once | CUTANEOUS | Status: DC

## 2012-09-19 MED ORDER — DEXAMETHASONE SODIUM PHOSPHATE 10 MG/ML IJ SOLN
10.0000 mg | Freq: Every day | INTRAMUSCULAR | Status: AC
  Filled 2012-09-19 (×3): qty 1

## 2012-09-19 MED ORDER — ATORVASTATIN CALCIUM 10 MG PO TABS
10.0000 mg | ORAL_TABLET | Freq: Every day | ORAL | Status: DC
  Administered 2012-09-19 – 2012-09-22 (×4): 10 mg via ORAL
  Filled 2012-09-19 (×5): qty 1

## 2012-09-19 MED ORDER — ONDANSETRON HCL 4 MG/2ML IJ SOLN: 4.0000 mg | Freq: Four times a day (QID) | INTRAMUSCULAR | Status: DC | PRN

## 2012-09-19 MED ORDER — OXYCODONE HCL 5 MG PO TABS
5.0000 mg | ORAL_TABLET | ORAL | Status: DC | PRN
  Administered 2012-09-19: 5 mg via ORAL
  Administered 2012-09-19 – 2012-09-20 (×2): 10 mg via ORAL
  Filled 2012-09-19: qty 1
  Filled 2012-09-19 (×2): qty 2

## 2012-09-19 MED ORDER — CEFAZOLIN SODIUM-DEXTROSE 2-3 GM-% IV SOLR
2.0000 g | Freq: Three times a day (TID) | INTRAVENOUS | Status: DC
  Administered 2012-09-19 – 2012-09-22 (×9): 2 g via INTRAVENOUS
  Filled 2012-09-19 (×11): qty 50

## 2012-09-19 MED ORDER — HYDROMORPHONE HCL PF 1 MG/ML IJ SOLN
INTRAMUSCULAR | Status: AC
  Administered 2012-09-19: 0.5 mg via INTRAVENOUS
  Filled 2012-09-19: qty 1

## 2012-09-19 MED ORDER — CEFAZOLIN SODIUM-DEXTROSE 2-3 GM-% IV SOLR
INTRAVENOUS | Status: AC
  Filled 2012-09-19: qty 50

## 2012-09-19 MED ORDER — PHENYLEPHRINE HCL 10 MG/ML IJ SOLN
INTRAMUSCULAR | Status: DC | PRN
  Administered 2012-09-19 (×4): 40 ug via INTRAVENOUS

## 2012-09-19 MED ORDER — DEXAMETHASONE 6 MG PO TABS
10.0000 mg | ORAL_TABLET | Freq: Every day | ORAL | Status: AC
  Administered 2012-09-19 – 2012-09-21 (×3): 10 mg via ORAL
  Filled 2012-09-19 (×3): qty 1

## 2012-09-19 MED ORDER — CEFUROXIME SODIUM 1.5 G IJ SOLR
INTRAMUSCULAR | Status: AC
  Filled 2012-09-19: qty 1.5

## 2012-09-19 MED ORDER — ONDANSETRON HCL 4 MG/2ML IJ SOLN
INTRAMUSCULAR | Status: DC | PRN
  Administered 2012-09-19: 4 mg via INTRAVENOUS

## 2012-09-19 MED ORDER — VANCOMYCIN HCL 1000 MG IV SOLR
1000.0000 mg | Freq: Two times a day (BID) | INTRAVENOUS | Status: DC
  Administered 2012-09-19: 1000 mg via INTRAVENOUS

## 2012-09-19 MED ORDER — BISACODYL 5 MG PO TBEC
10.0000 mg | DELAYED_RELEASE_TABLET | Freq: Every day | ORAL | Status: DC
  Administered 2012-09-19 – 2012-09-21 (×3): 10 mg via ORAL
  Filled 2012-09-19 (×3): qty 2

## 2012-09-19 MED ORDER — LIDOCAINE HCL (CARDIAC) 20 MG/ML IV SOLN
INTRAVENOUS | Status: DC | PRN
  Administered 2012-09-19: 80 mg via INTRAVENOUS

## 2012-09-19 MED ORDER — TAMSULOSIN HCL 0.4 MG PO CAPS
0.4000 mg | ORAL_CAPSULE | Freq: Every day | ORAL | Status: DC
  Administered 2012-09-19 – 2012-09-22 (×4): 0.4 mg via ORAL
  Filled 2012-09-19 (×5): qty 1

## 2012-09-19 MED ORDER — DIPHENHYDRAMINE HCL 12.5 MG/5ML PO ELIX: 12.5000 mg | ORAL_SOLUTION | ORAL | Status: DC | PRN

## 2012-09-19 MED ORDER — LACTATED RINGERS IV SOLN
INTRAVENOUS | Status: DC | PRN
  Administered 2012-09-19 (×2): via INTRAVENOUS

## 2012-09-19 MED ORDER — BUPIVACAINE-EPINEPHRINE PF 0.25-1:200000 % IJ SOLN
INTRAMUSCULAR | Status: AC
  Filled 2012-09-19: qty 30

## 2012-09-19 MED ORDER — METOCLOPRAMIDE HCL 10 MG PO TABS: 5.0000 mg | ORAL_TABLET | Freq: Three times a day (TID) | ORAL | Status: DC | PRN

## 2012-09-19 MED ORDER — ARTIFICIAL TEARS OP OINT
TOPICAL_OINTMENT | OPHTHALMIC | Status: DC | PRN
  Administered 2012-09-19: 1 via OPHTHALMIC

## 2012-09-19 MED ORDER — NAPHAZOLINE-GLYCERIN 0.012-0.25 % OP SOLN
1.0000 [drp] | OPHTHALMIC | Status: DC
  Administered 2012-09-20 – 2012-09-22 (×2): 1 [drp] via OPHTHALMIC
  Filled 2012-09-19 (×3): qty 1

## 2012-09-19 MED ORDER — SODIUM CHLORIDE 0.9 % IR SOLN
Status: DC | PRN
  Administered 2012-09-19: 1000 mL
  Administered 2012-09-19: 3000 mL

## 2012-09-19 MED ORDER — ONDANSETRON HCL 4 MG/2ML IJ SOLN: 4.0000 mg | Freq: Once | INTRAMUSCULAR | Status: DC | PRN

## 2012-09-19 MED ORDER — CELECOXIB 200 MG PO CAPS
200.0000 mg | ORAL_CAPSULE | Freq: Two times a day (BID) | ORAL | Status: DC
  Administered 2012-09-19 – 2012-09-22 (×7): 200 mg via ORAL
  Filled 2012-09-19 (×9): qty 1

## 2012-09-19 MED ORDER — ADULT MULTIVITAMIN W/MINERALS CH
1.0000 | ORAL_TABLET | Freq: Every day | ORAL | Status: DC
  Administered 2012-09-19 – 2012-09-21 (×3): 1 via ORAL
  Filled 2012-09-19 (×4): qty 1

## 2012-09-19 SURGICAL SUPPLY — 73 items
BANDAGE ESMARK 6X9 LF (GAUZE/BANDAGES/DRESSINGS) ×1 IMPLANT
BLADE SAGITTAL 25.0X1.19X90 (BLADE) ×2 IMPLANT
BLADE SAW SGTL 11.0X1.19X90.0M (BLADE) IMPLANT
BLADE SAW SGTL 13.0X1.19X90.0M (BLADE) ×2 IMPLANT
BLADE SURG 10 STRL SS (BLADE) ×8 IMPLANT
BNDG CMPR 9X6 STRL LF SNTH (GAUZE/BANDAGES/DRESSINGS) ×1
BNDG CMPR MED 15X6 ELC VLCR LF (GAUZE/BANDAGES/DRESSINGS) ×1
BNDG ELASTIC 6X15 VLCR STRL LF (GAUZE/BANDAGES/DRESSINGS) ×2 IMPLANT
BNDG ESMARK 6X9 LF (GAUZE/BANDAGES/DRESSINGS) ×2
BOWL SMART MIX CTS (DISPOSABLE) ×2 IMPLANT
CEMENT HV SMART SET (Cement) ×4 IMPLANT
CLOTH BEACON ORANGE TIMEOUT ST (SAFETY) ×2 IMPLANT
CLSR STERI-STRIP ANTIMIC 1/2X4 (GAUZE/BANDAGES/DRESSINGS) ×1 IMPLANT
COVER BACK TABLE 24X17X13 BIG (DRAPES) IMPLANT
COVER PROBE W GEL 5X96 (DRAPES) IMPLANT
COVER SURGICAL LIGHT HANDLE (MISCELLANEOUS) ×2 IMPLANT
CUFF TOURNIQUET SINGLE 34IN LL (TOURNIQUET CUFF) ×2 IMPLANT
CUFF TOURNIQUET SINGLE 44IN (TOURNIQUET CUFF) IMPLANT
DRAPE EXTREMITY T 121X128X90 (DRAPE) ×2 IMPLANT
DRAPE INCISE IOBAN 66X45 STRL (DRAPES) ×2 IMPLANT
DRAPE PROXIMA HALF (DRAPES) ×2 IMPLANT
DRAPE U-SHAPE 47X51 STRL (DRAPES) ×2 IMPLANT
DRSG ADAPTIC 3X8 NADH LF (GAUZE/BANDAGES/DRESSINGS) ×2 IMPLANT
DRSG PAD ABDOMINAL 8X10 ST (GAUZE/BANDAGES/DRESSINGS) ×3 IMPLANT
DURAPREP 26ML APPLICATOR (WOUND CARE) ×2 IMPLANT
ELECT CAUTERY BLADE 6.4 (BLADE) ×2 IMPLANT
ELECT REM PT RETURN 9FT ADLT (ELECTROSURGICAL) ×2
ELECTRODE REM PT RTRN 9FT ADLT (ELECTROSURGICAL) ×1 IMPLANT
EVACUATOR 1/8 PVC DRAIN (DRAIN) ×1 IMPLANT
FACESHIELD LNG OPTICON STERILE (SAFETY) ×2 IMPLANT
GLOVE BIO SURGEON STRL SZ7 (GLOVE) ×2 IMPLANT
GLOVE BIOGEL PI IND STRL 7.0 (GLOVE) ×2 IMPLANT
GLOVE BIOGEL PI IND STRL 7.5 (GLOVE) ×1 IMPLANT
GLOVE BIOGEL PI INDICATOR 7.0 (GLOVE) ×2
GLOVE BIOGEL PI INDICATOR 7.5 (GLOVE) ×1
GLOVE BIOGEL PI ORTHO PRO SZ7 (GLOVE) ×1
GLOVE PI ORTHO PRO STRL SZ7 (GLOVE) IMPLANT
GLOVE SS BIOGEL STRL SZ 7.5 (GLOVE) ×1 IMPLANT
GLOVE SUPERSENSE BIOGEL SZ 7.5 (GLOVE) ×1
GLOVE SURG SS PI 7.0 STRL IVOR (GLOVE) ×2 IMPLANT
GOWN PREVENTION PLUS XLARGE (GOWN DISPOSABLE) ×5 IMPLANT
GOWN STRL NON-REIN LRG LVL3 (GOWN DISPOSABLE) ×2 IMPLANT
GOWN STRL REIN XL XLG (GOWN DISPOSABLE) ×1 IMPLANT
HANDPIECE INTERPULSE COAX TIP (DISPOSABLE) ×2
HOOD PEEL AWAY FACE SHEILD DIS (HOOD) ×5 IMPLANT
IMMOBILIZER KNEE 22 UNIV (SOFTGOODS) ×1 IMPLANT
INSERT CUSHION PRONEVIEW LG (MISCELLANEOUS) IMPLANT
KIT BASIN OR (CUSTOM PROCEDURE TRAY) ×2 IMPLANT
KIT ROOM TURNOVER OR (KITS) ×2 IMPLANT
MANIFOLD NEPTUNE II (INSTRUMENTS) ×2 IMPLANT
NS IRRIG 1000ML POUR BTL (IV SOLUTION) ×2 IMPLANT
PACK TOTAL JOINT (CUSTOM PROCEDURE TRAY) ×2 IMPLANT
PAD ARMBOARD 7.5X6 YLW CONV (MISCELLANEOUS) ×3 IMPLANT
PAD CAST 4YDX4 CTTN HI CHSV (CAST SUPPLIES) ×1 IMPLANT
PADDING CAST COTTON 4X4 STRL (CAST SUPPLIES) ×2
PADDING CAST COTTON 6X4 STRL (CAST SUPPLIES) ×2 IMPLANT
POSITIONER HEAD PRONE TRACH (MISCELLANEOUS) ×2 IMPLANT
RUBBERBAND STERILE (MISCELLANEOUS) ×2 IMPLANT
SET HNDPC FAN SPRY TIP SCT (DISPOSABLE) ×1 IMPLANT
SPONGE GAUZE 4X4 12PLY (GAUZE/BANDAGES/DRESSINGS) ×2 IMPLANT
STRIP CLOSURE SKIN 1/2X4 (GAUZE/BANDAGES/DRESSINGS) ×2 IMPLANT
SUCTION FRAZIER TIP 10 FR DISP (SUCTIONS) ×2 IMPLANT
SUT ETHIBOND NAB CT1 #1 30IN (SUTURE) ×4 IMPLANT
SUT MNCRL AB 3-0 PS2 18 (SUTURE) ×2 IMPLANT
SUT VIC AB 0 CT1 27 (SUTURE) ×4
SUT VIC AB 0 CT1 27XBRD ANBCTR (SUTURE) ×2 IMPLANT
SUT VIC AB 2-0 CT1 27 (SUTURE) ×2
SUT VIC AB 2-0 CT1 TAPERPNT 27 (SUTURE) ×2 IMPLANT
SYR 30ML SLIP (SYRINGE) ×2 IMPLANT
TOWEL OR 17X24 6PK STRL BLUE (TOWEL DISPOSABLE) ×2 IMPLANT
TOWEL OR 17X26 10 PK STRL BLUE (TOWEL DISPOSABLE) ×2 IMPLANT
TRAY FOLEY CATH 14FR (SET/KITS/TRAYS/PACK) ×2 IMPLANT
WATER STERILE IRR 1000ML POUR (IV SOLUTION) ×5 IMPLANT

## 2012-09-19 NOTE — H&P (View-Only) (Signed)
TOTAL KNEE ADMISSION H&P  Patient is being admitted for right total knee arthroplasty.  Subjective:  Chief Complaint:right knee pain.  HPI: Mark Berger, 78 y.o. male, has a history of pain and functional disability in the right knee due to arthritis and has failed non-surgical conservative treatments for greater than 12 weeks to includeNSAID's and/or analgesics, corticosteriod injections, flexibility and strengthening excercises, supervised PT with diminished ADL's post treatment, use of assistive devices, weight reduction as appropriate and activity modification.  Onset of symptoms was gradual, starting 8 years ago with gradually worsening course since that time. The patient noted no past surgery on the right knee(s).  Patient currently rates pain in the right knee(s) at 7 out of 10 with activity. Patient has night pain, worsening of pain with activity and weight bearing, pain that interferes with activities of daily living, pain with passive range of motion, crepitus and joint swelling.  Patient has evidence of subchondral sclerosis, periarticular osteophytes and joint subluxation by imaging studies. There is no active infection.  Patient Active Problem List   Diagnosis Date Noted  . Shortness of breath   . Anxiety   . BPH (benign prostatic hyperplasia)   . Arthritis   . Kidney calculi   . Right knee DJD   . Syncope 12/19/2011    Class: Acute  . Trigeminal neuralgia 12/19/2011  . Osteoarthritis of knee 12/19/2011  . Urinary tract infection 12/19/2011    Class: Acute  . Benign prostate hyperplasia 12/19/2011  . Chronic low back pain 12/19/2011    Class: Chronic   Past Medical History  Diagnosis Date  . Arthritis   . Trigeminal neuralgia   . Kidney calculi   . Right knee DJD   . Shortness of breath     exertion  . Anxiety   . Pneumonia     history of x3  . Urinary tract infection   . BPH (benign prostatic hyperplasia)     Past Surgical History  Procedure Date  .  Hernia repair   . Hemorrhoid surgery   . Eye surgery     bilateral     (Not in a hospital admission) No Known Allergies   Current Outpatient Prescriptions on File Prior to Visit  Medication Sig Dispense Refill  . atorvastatin (LIPITOR) 10 MG tablet Take 10 mg by mouth daily.      . bisacodyl (DULCOLAX) 5 MG EC tablet Take 5 mg by mouth daily as needed. constipation      . cholecalciferol (VITAMIN D) 1000 UNITS tablet Take 1,000 Units by mouth daily.      . LORazepam (ATIVAN) 1 MG tablet Take 0.5 mg by mouth at bedtime as needed. For sleep      . Multiple Vitamins-Minerals (MULTIVITAMINS THER. W/MINERALS) TABS Take 1 tablet by mouth at bedtime.      . Naphazoline-Glycerin (REDNESS RELIEF OP) Apply 1 drop to eye every other day.      . omega-3 acid ethyl esters (LOVAZA) 1 G capsule Take 2 g by mouth at bedtime.       . senna (SENOKOT) 8.6 MG TABS Take 1 tablet by mouth at bedtime.      . Tamsulosin HCl (FLOMAX) 0.4 MG CAPS Take 1 capsule (0.4 mg total) by mouth daily.  30 capsule  0    History  Substance Use Topics  . Smoking status: Never Smoker   . Smokeless tobacco: Not on file  . Alcohol Use: No    No family history on file.     Review of Systems  Constitutional: Negative.   HENT: Negative.   Eyes: Negative.   Respiratory: Negative.   Cardiovascular: Negative.   Gastrointestinal: Negative.   Genitourinary: Negative.   Musculoskeletal: Positive for back pain and joint pain.       Right knee  Skin: Negative.   Neurological: Negative.   Endo/Heme/Allergies: Negative.   Psychiatric/Behavioral: Negative.     Objective:  Physical Exam  Constitutional: He is oriented to person, place, and time. He appears well-developed and well-nourished.  Musculoskeletal: He exhibits edema and tenderness.       Examination of his right knee reveals mild varus deformity, 1 to 2+ crepitation, 1+ synovitis range of motion -5 to 120 degrees with diffuse pain and normal patella tracking. Exam  of the left knee reveals mild varus deformity 1+ crepitation 1+ synovitis range of motion -5 to 120 degrees knee is stable with normal patella tracking. Vascular exam: pulses 2+ and symmetric.  Neurological: He is alert and oriented to person, place, and time.  Skin: Skin is warm and dry.  Psychiatric: He has a normal mood and affect. His behavior is normal. Thought content normal.    Vital signs in last 24 hours: Last recorded: 11/06 1300   BP: 166/73 Pulse: 97  Temp: 97.8 F (36.6 C)    Height: 5' 8" (1.727 m) SpO2: 94  Weight: 93.895 kg (207 lb)     Labs:   Estimated Body mass index is 31.47 kg/(m^2) as calculated from the following:   Height as of this encounter: 5' 8"(1.727 m).   Weight as of this encounter: 207 lb(93.895 kg).   Imaging Review Plain radiographs demonstrate severe degenerative joint disease of the right knee(s). The overall alignment issignificant varus. The bone quality appears to be good for age and reported activity level.  Assessment/Plan:  End stage arthritis, right knee   The patient history, physical examination, clinical judgment of the provider and imaging studies are consistent with end stage degenerative joint disease of the right knee(s) and total knee arthroplasty is deemed medically necessary. The treatment options including medical management, injection therapy arthroscopy and arthroplasty were discussed at length. The risks and benefits of total knee arthroplasty were presented and reviewed. The risks due to aseptic loosening, infection, stiffness, patella tracking problems, thromboembolic complications and other imponderables were discussed. The patient acknowledged the explanation, agreed to proceed with the plan and consent was signed. Patient is being admitted for inpatient treatment for surgery, pain control, PT, OT, prophylactic antibiotics, VTE prophylaxis, progressive ambulation and ADL's and discharge planning. The patient is planning to  be discharged to skilled nursing facility   

## 2012-09-19 NOTE — Progress Notes (Signed)
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 No results found for this basename: VANCOTROUGH:2,VANCOPEAK:2,VANCORANDOM:2,GENTTROUGH:2,GENTPEAK:2,GENTRANDOM:2,TOBRATROUGH:2,TOBRAPEAK:2,TOBRARND:2,AMIKACINPEAK:2,AMIKACINTROU:2,AMIKACIN:2, in the last 72 hours   Microbiology: Recent Results (from the past 720 hour(s))  SURGICAL PCR SCREEN     Status: Normal   Collection Time   09/13/12 11:35 AM      Component Value Range Status Comment   MRSA, PCR NEGATIVE  NEGATIVE Final    Staphylococcus aureus NEGATIVE  NEGATIVE Final     Medical History: Past Medical History  Diagnosis Date  . Arthritis   . Trigeminal neuralgia   . Kidney calculi   . Right knee DJD   . Shortness of breath     exertion  . Anxiety   . Pneumonia     history of x3  . Urinary tract infection   . BPH (benign prostatic hyperplasia)     Medications:  Prescriptions prior to admission  Medication Sig Dispense Refill  . atorvastatin (LIPITOR) 10 MG tablet Take 10 mg by mouth daily.      . bisacodyl (DULCOLAX) 5 MG EC tablet Take 5 mg by mouth daily as needed. constipation      . cholecalciferol (VITAMIN D) 1000 UNITS tablet Take 1,000 Units by mouth daily.      Marland Kitchen LORazepam (ATIVAN) 1 MG tablet Take 0.5 mg by mouth at bedtime as needed. For sleep      .  Multiple Vitamins-Minerals (MULTIVITAMINS THER. W/MINERALS) TABS Take 1 tablet by mouth at bedtime.      . Naphazoline-Glycerin (REDNESS RELIEF OP) Apply 1 drop to eye every other day.      . omega-3 acid ethyl esters (LOVAZA) 1 G capsule Take 2 g by mouth at bedtime.       . senna (SENOKOT) 8.6 MG TABS Take 1 tablet by mouth at bedtime.      . Tamsulosin HCl (FLOMAX) 0.4 MG CAPS Take 1 capsule (0.4 mg total) by mouth daily.  30 capsule  0   Assessment: Mark Berger s/p TKA to receive Vancomycin for post-op surgical prophylaxis due to patient having h/o recurrent abscess/boil on operative leg and MRSA carrier. Patient received Vancomycin 1g pre-op (~0915). Noted MD's plan to treat with IV Vancomycin and Ancef while hospitalized and discharge later in week on oral Doxycycline.  - Weight: 94kg - CrCl ~75 ml/min  Goal of Therapy:  Vancomycin trough level 10-15 mcg/ml  Plan:  1. Vancomycin 1g IV q12h 2. Monitor renal function, clinical course and discharge plans 3. Will not plan to check Vancomycin trough unless patient stays > 5 days  Stephanos, Fan [phone] 09/19/2012,1:41 PM

## 2012-09-19 NOTE — Anesthesia Postprocedure Evaluation (Signed)
  Anesthesia Post-op Note  Patient: Mark Berger  Procedure(s) Performed: Procedure(s) (LRB) with comments: TOTAL KNEE ARTHROPLASTY (Right) - right total knee arthroplasty  Patient Location: PACU  Anesthesia Type:General and GA combined with regional for post-op pain  Level of Consciousness: awake, alert , oriented and patient cooperative  Airway and Oxygen Therapy: Patient Spontanous Breathing  Post-op Pain: mild  Post-op Assessment: Post-op Vital signs reviewed, Patient's Cardiovascular Status Stable, Respiratory Function Stable, Patent Airway, No signs of Nausea or vomiting and Pain level controlled  Post-op Vital Signs: stable  Complications: No apparent anesthesia complications

## 2012-09-19 NOTE — Progress Notes (Signed)
CARE MANAGEMENT NOTE 09/19/2012  Patient:  Mark Berger, Mark Berger   Account Number:  [id number]  Date Initiated:  09/19/2012  Documentation initiated by:  Vance Peper  Subjective/Objective Assessment:   78 yr old male s/p right total knee arthroplasty.     Action/Plan:   Patient is for shortterm rehab at [facility]. Social Worker informed.   Anticipated DC Date:  09/21/2012   Anticipated DC Plan:  SKILLED NURSING FACILITY  In-house referral  Clinical Social Worker      DC Planning Services  CM consult      Choice offered to / List presented to:             Status of service:  Completed, signed off Medicare Important Message given?   (If response is "NO", the following Medicare IM given date fields will be blank) Date Medicare IM given:   Date Additional Medicare IM given:    Discharge Disposition:  SKILLED NURSING FACILITY  Per UR Regulation:    If discussed at Long Length of Stay Meetings, dates discussed:    Comments:

## 2012-09-19 NOTE — Interval H&P Note (Signed)
History and Physical Interval Note:  09/19/2012 8:50 AM  Mark Berger  has presented today for surgery, with the diagnosis of DJD RIGHT KNEE  The various methods of treatment have been discussed with the patient and family. After consideration of risks, benefits and other options for treatment, the patient has consented to  Procedure(s) (LRB) with comments: TOTAL KNEE ARTHROPLASTY (Right) as a surgical intervention .  The patient's history has been reviewed, patient examined, no change in status, stable for surgery.  I have reviewed the patient's chart and labs.  Questions were answered to the patient's satisfaction.     Salvatore Marvel A

## 2012-09-19 NOTE — Preoperative (Signed)
Beta Blockers   Reason not to administer Beta Blockers:Not Applicable 

## 2012-09-19 NOTE — Plan of Care (Signed)
Problem: Consults Goal: Diagnosis- Total Joint Replacement Primary Total Knee Right     

## 2012-09-19 NOTE — Progress Notes (Signed)
Orthopedic Tech Progress Note Patient Details:  Mark Berger 08-11-1924 [id number] CPM applied to Right LE with appropriate settings; OHF applied to bed CPM Right Knee CPM Right Knee: On Right Knee Flexion (Degrees): 60  Right Knee Extension (Degrees): 0    Mark Berger 09/19/2012, 1:34 PM

## 2012-09-19 NOTE — Anesthesia Procedure Notes (Addendum)
Anesthesia Regional Block:  Femoral nerve block  Pre-Anesthetic Checklist: ,, timeout performed, Correct Patient, Correct Site, Correct Laterality, Correct Procedure, Correct Position, site marked, Risks and benefits discussed,  Surgical consent,  Pre-op evaluation,  At surgeon's request and post-op pain management  Laterality: Right  Prep: Maximum Sterile Barrier Precautions used, chloraprep and alcohol swabs       Needles:  Injection technique: Single-shot  Needle Type: Stimulator Needle - 80        Needle insertion depth: 7 cm   Additional Needles:  Procedures: nerve stimulator Femoral nerve block  Nerve Stimulator or Paresthesia:  Response: 0.5 mA, 0.1 ms, 7 cm  Additional Responses:   Narrative:  Start time: 09/19/2012 8:40 AM End time: 09/19/2012 8:45 AM Injection made incrementally with aspirations every 5 mL.  Performed by: Personally  Anesthesiologist: Maren Beach MD  Additional Notes: Pt accepts procedure and risks. 25 cc 0.5% Marcaine w/ epi w/o difficulty or discomfort. Pt tolerated well. GES   Procedure Name: LMA Insertion Date/Time: 09/19/2012 9:10 AM Performed by: Romie Minus K Pre-anesthesia Checklist: Patient identified, Emergency Drugs available, Suction available, Patient being monitored and Timeout performed Patient Re-evaluated:Patient Re-evaluated prior to inductionOxygen Delivery Method: Circle system utilized Preoxygenation: Pre-oxygenation with 100% oxygen Intubation Type: IV induction Ventilation: Mask ventilation without difficulty LMA: LMA inserted LMA Size: 5.0 Number of attempts: 1 Placement Confirmation: positive ETCO2,  CO2 detector and breath sounds checked- equal and bilateral Tube secured with: Tape Dental Injury: Teeth and Oropharynx as per pre-operative assessment  Comments: Inserted by Shanda Bumps - paramedic student

## 2012-09-19 NOTE — Op Note (Signed)
MRN:     [id number] DOB/AGE:    December 01, 1923 / 78 y.o.       OPERATIVE REPORT    DATE OF PROCEDURE:  09/19/2012       PREOPERATIVE DIAGNOSIS:   DJD RIGHT KNEE      Estimated Body mass index is 29.56 kg/(m^2) as calculated from the following:   Height as of 08/10/12: 5\' 10" (1.778 m).   Weight as of 08/10/12: 206 lb(93.441 kg).                                                        POSTOPERATIVE DIAGNOSIS:   DJD RIGHT KNEE                                                                      PROCEDURE:  Procedure(s): TOTAL KNEE ARTHROPLASTY Using Depuy Sigma RP implants #4 Femur, #5Tibia, 10mm sigma RP bearing, 38 Patella     SURGEON: Jasmeet Manton,Mainor A    ASSISTANT:  Kirstin Shepperson PA-C   (Present and scrubbed throughout the case, critical for assistance with exposure, retraction, instrumentation, and closure.)         ANESTHESIA: GET with Femoral Nerve Block  DRAINS: foley, 2 medium hemovac in knee   TOURNIQUET TIME:   COMPLICATIONS:  None     SPECIMENS: None   INDICATIONS FOR PROCEDURE: The patient has  DJD RIGHT KNEE, varus deformities, XR shows bone on bone arthritis. Patient has failed all conservative measures including anti-inflammatory medicines, narcotics, attempts at  exercise and weight loss, cortisone injections and viscosupplementation.  Risks and benefits of surgery have been discussed, questions answered.   DESCRIPTION OF PROCEDURE: The patient identified by armband, received  right femoral nerve block and IV antibiotics, in the holding area at [facility]. Patient taken to the operating room, appropriate anesthetic  monitors were attached General endotracheal anesthesia induced with  the patient in supine position, Foley catheter was inserted. Tourniquet  applied high to the operative thigh. Lateral post and foot positioner  applied to the table, the lower extremity was then prepped and draped  in usual sterile fashion from the ankle to the tourniquet.  Time-out procedure was performed. The limb was wrapped with an Esmarch bandage and the tourniquet inflated to 365 mmHg. We began the operation by making the anterior midline incision starting at handbreadth above the patella going over the patella 1 cm medial to and  4 cm distal to the tibial tubercle. Small bleeders in the skin and the  subcutaneous tissue identified and cauterized. Transverse retinaculum was incised and reflected medially and a medial parapatellar arthrotomy was accomplished. the patella was everted and theprepatellar fat pad resected. The superficial medial collateral  ligament was then elevated from anterior to posterior along the proximal  flare of the tibia and anterior half of the menisci resected. The knee was hyperflexed exposing bone on bone arthritis. Peripheral and notch osteophytes as well as the cruciate ligaments were then resected. We continued to  work our way around posteriorly along the proximal tibia, and externally  rotated the tibia  subluxing it out from underneath the femur. A McHale  retractor was placed through the notch and a lateral Hohmann retractor  placed, and we then drilled through the proximal tibia in line with the  axis of the tibia followed by an intramedullary guide rod and 2-degree  posterior slope cutting guide. The tibial cutting guide was pinned into place  allowing resection of 4 mm of bone medially and about 6 mm of bone  laterally because of her varus deformity. Satisfied with the tibial resection, we then  entered the distal femur 2 mm anterior to the PCL origin with the  intramedullary guide rod and applied the distal femoral cutting guide  set at 11mm, with 5 degrees of valgus. This was pinned along the  epicondylar axis. At this point, the distal femoral cut was accomplished without difficulty. We then sized for a #4 femoral component and pinned the guide in 3 degrees of external rotation.The chamfer cutting guide was pinned into place.  The anterior, posterior, and chamfer cuts were accomplished without difficulty followed by  the Sigma RP box cutting guide and the box cut. We also removed posterior osteophytes from the posterior femoral condyles. At this  time, the knee was brought into full extension. We checked our  extension and flexion gaps and found them symmetric at 10mm.  The patella thickness measured at 17 mm. We set the cutting guide at 11 and removed the posterior 6 mm  of the patella sized for 38 button and drilled the lollipop. The knee  was then once again hyperflexed exposing the proximal tibia. We sized for a #5 tibial base plate, applied the smokestack and the conical reamer followed by the the Delta fin keel punch. We then hammered into place the Sigma RP trial femoral component, inserted a 10-mm trial bearing, trial patellar button, and took the knee through range of motion from 0-130 degrees. No thumb pressure was required for patellar  tracking. At this point, all trial components were removed, a double batch of DePuy HV cement with 1500 mg of Zinacef was mixed and applied to all bony metallic mating surfaces except for the posterior condyles of the femur itself. In order, we  hammered into place the tibial tray and removed excess cement, the femoral component and removed excess cement, a 10-mm Sigma RP bearing  was inserted, and the knee brought to full extension with compression.  The patellar button was clamped into place, and excess cement  removed. While the cement cured the wound was irrigated out with normal saline solution pulse lavage, and medium Hemovac drains were placed.. Ligament stability and patellar tracking were checked and found to be excellent. The tourniquet was then released and hemostasis was obtained with cautery. The parapatellar arthrotomy was closed with  #1 ethibond suture. The subcutaneous tissue with 0 and 2-0 undyed  Vicryl suture, and 4-0 Monocryl.. A dressing of Xeroform,  4 x 4,  dressing sponges, Webril, and Ace wrap applied. Needle and sponge count were correct times 2.The patient awakened, extubated, and taken to recovery room without difficulty. Vascular status was normal, pulses 2+ and symmetric.   Yoshio Seliga,Emauri A 09/19/2012, 10:38 AM

## 2012-09-19 NOTE — Anesthesia Preprocedure Evaluation (Addendum)
Anesthesia Evaluation  Patient identified by MRN, date of birth, ID band Patient awake    Reviewed: Allergy & Precautions, H&P , NPO status , Patient's Chart, lab work & pertinent test results  History of Anesthesia Complications Negative for: history of anesthetic complications  Airway Mallampati: II TM Distance: >3 FB Neck ROM: full    Dental  (+) Teeth Intact and Dental Advisory Given   Pulmonary shortness of breath and with exertion,  breath sounds clear to auscultation        Cardiovascular Rhythm:regular Rate:Normal     Neuro/Psych Right trigeminal neuralgia  Neuromuscular disease    GI/Hepatic   Endo/Other    Renal/GU Renal disease     Musculoskeletal   Abdominal   Peds  Hematology   Anesthesia Other Findings   Reproductive/Obstetrics                          Anesthesia Physical Anesthesia Plan  ASA: II  Anesthesia Plan: General   Post-op Pain Management:    Induction: Intravenous  Airway Management Planned: LMA and Oral ETT  Additional Equipment:   Intra-op Plan:   Post-operative Plan:   Informed Consent: I have reviewed the patients History and Physical, chart, labs and discussed the procedure including the risks, benefits and alternatives for the proposed anesthesia with the patient or authorized representative who has indicated his/her understanding and acceptance.     Plan Discussed with: CRNA, Surgeon and Anesthesiologist  Anesthesia Plan Comments:         Anesthesia Quick Evaluation

## 2012-09-19 NOTE — Transfer of Care (Signed)
Immediate Anesthesia Transfer of Care Note  Patient: Mark Berger  Procedure(s) Performed: Procedure(s) (LRB) with comments: TOTAL KNEE ARTHROPLASTY (Right) - right total knee arthroplasty  Patient Location: PACU  Anesthesia Type:General  Level of Consciousness: awake, oriented and sedated  Airway & Oxygen Therapy: Patient Spontanous Breathing and Patient connected to nasal cannula oxygen  Post-op Assessment: Report given to PACU RN and Post -op Vital signs reviewed and stable  Post vital signs: Reviewed and stable  Complications: No apparent anesthesia complications

## 2012-09-19 NOTE — Evaluation (Signed)
Physical Therapy Evaluation Patient Details Name: Mark Berger MRN: [id number] DOB: 10-21-1924 Today's Date: 09/19/2012 Time: 1191-4782 PT Time Calculation (min): 35 min  PT Assessment / Plan / Recommendation Clinical Impression  Patient is an 78 yo male s/p Rt. TKA.  Patient with difficulty with mobility today, and decreased cognition.  Patient does not have 24 hour assist.  Recommend SNF at discharge for continued therapy.  Patient will benefit from acute PT to maximize independence prior to discharge to SNF.    PT Assessment  Patient needs continued PT services    Follow Up Recommendations  SNF    Does the patient have the potential to tolerate intense rehabilitation      Barriers to Discharge Decreased caregiver support      Equipment Recommendations  None recommended by PT    Recommendations for Other Services     Frequency 7X/week    Precautions / Restrictions Precautions Precautions: Knee;Fall Precaution Booklet Issued: Yes (comment) Precaution Comments: Reviewed precautions Required Braces or Orthoses: Knee Immobilizer - Right Knee Immobilizer - Right: On except when in CPM;Discontinue post op day 2 Restrictions Weight Bearing Restrictions: Yes RLE Weight Bearing: Weight bearing as tolerated   Pertinent Vitals/Pain       Mobility  Bed Mobility Bed Mobility: Supine to Sit;Sit to Supine Supine to Sit: 3: Mod assist;With rails;HOB elevated Sit to Supine: 3: Mod assist;With rail;HOB elevated Details for Bed Mobility Assistance: Verbal cues for technique.  Assist to move RLE on/off bed.  Assist to raise trunk. Transfers Transfers: Sit to Stand;Stand to Sit Sit to Stand: 3: Mod assist;With upper extremity assist;From elevated surface;From bed Stand to Sit: 4: Min assist;With upper extremity assist;To bed Details for Transfer Assistance: Verbal cues for technique for standing, hand placement, placement of RLE during transfer.  Assist to lift hips off bed.   Attempted to stand x3 - unable to reach upright position. Ambulation/Gait Ambulation/Gait Assistance: Not tested (comment)           PT Diagnosis: Difficulty walking;Generalized weakness;Acute pain;Altered mental status  PT Problem List: Decreased strength;Decreased range of motion;Decreased activity tolerance;Decreased mobility;Decreased cognition;Decreased knowledge of use of DME;Decreased safety awareness;Decreased knowledge of precautions;Pain PT Treatment Interventions: DME instruction;Gait training;Functional mobility training;Therapeutic exercise;Patient/family education   PT Goals Acute Rehab PT Goals PT Goal Formulation: With patient Time For Goal Achievement: 10/03/12 Potential to Achieve Goals: Good Pt will go Supine/Side to Sit: with supervision;with HOB 0 degrees PT Goal: Supine/Side to Sit - Progress: Goal set today Pt will go Sit to Supine/Side: with supervision;with HOB 0 degrees PT Goal: Sit to Supine/Side - Progress: Goal set today Pt will go Sit to Stand: with supervision;with upper extremity assist PT Goal: Sit to Stand - Progress: Goal set today Pt will go Stand to Sit: with supervision;with upper extremity assist PT Goal: Stand to Sit - Progress: Goal set today Pt will Transfer Bed to Chair/Chair to Bed: with supervision PT Transfer Goal: Bed to Chair/Chair to Bed - Progress: Goal set today Pt will Ambulate: 51 - 150 feet;with supervision;with rolling walker PT Goal: Ambulate - Progress: Goal set today Pt will Perform Home Exercise Program: with min assist PT Goal: Perform Home Exercise Program - Progress: Goal set today  Visit Information  Last PT Received On: 09/19/12 Assistance Needed: +2    Subjective Data  Subjective: Very talkative.  Asking questions.  "How long will it be before I'll be able to walk on my leg?"  "I've always been in good shape, exercising at the  club" Patient Stated Goal: To walk and get stronger.   Prior Functioning  Home  Living Lives With: Alone Available Help at Discharge: Skilled Nursing Facility Home Adaptive Equipment: Bedside commode/3-in-1;Straight cane;Walker - rolling;Shower chair with back Prior Function Level of Independence: Independent with assistive device(s) (Using cane for 3-4 weeks pta) Able to Take Stairs?: Yes Driving: Yes Vocation: Retired Musician: No difficulties    Cognition  Overall Cognitive Status: Impaired Area of Impairment: Safety/judgement;Memory;Problem solving Arousal/Alertness: Awake/alert Orientation Level: Appears intact for tasks assessed Behavior During Session: Restless Memory Deficits: Repeating same questions multiple times. Safety/Judgement: Impulsive;Decreased awareness of need for assistance Safety/Judgement - Other Comments: Cues to slow down and wait for set up to be complete before beginning task.  Attempted to get OOB with RLE still in CPM.    Extremity/Trunk Assessment Right Upper Extremity Assessment RUE ROM/Strength/Tone: WFL for tasks assessed RUE Sensation: WFL - Light Touch Left Upper Extremity Assessment LUE ROM/Strength/Tone: WFL for tasks assessed LUE Sensation: WFL - Light Touch Right Lower Extremity Assessment RLE ROM/Strength/Tone: Deficits;Unable to fully assess;Due to pain RLE ROM/Strength/Tone Deficits: Able to assist moving LE off bed.  Unable to move against gravity. Left Lower Extremity Assessment LLE ROM/Strength/Tone: WFL for tasks assessed LLE Sensation: WFL - Light Touch   Balance    End of Session PT - End of Session Equipment Utilized During Treatment: Gait belt;Oxygen;Right knee immobilizer Activity Tolerance: Patient limited by pain;Patient limited by fatigue Patient left: in bed;in CPM;with call bell/phone within reach Nurse Communication: Mobility status CPM Right Knee CPM Right Knee: On Right Knee Flexion (Degrees): 60  Right Knee Extension (Degrees): 0   GP     Vena Austria 09/19/2012, 8:06  PM Durenda Hurt. Renaldo Fiddler, [facility] Acute Rehab Services Pager [phone]

## 2012-09-20 ENCOUNTER — Encounter (HOSPITAL_COMMUNITY): Payer: Self-pay | Admitting: Orthopedic Surgery

## 2012-09-20 ENCOUNTER — Other Ambulatory Visit: Payer: Self-pay

## 2012-09-20 LAB — BASIC METABOLIC PANEL
BUN: 10 mg/dL (ref 6–23)
CO2: 24 mEq/L (ref 19–32)
Calcium: 8.7 mg/dL (ref 8.4–10.5)
Creatinine, Ser: 0.66 mg/dL (ref 0.50–1.35)
GFR calc non Af Amer: 85 mL/min — ABNORMAL LOW (ref >90)
Glucose, Bld: 144 mg/dL — ABNORMAL HIGH (ref 70–99)
Sodium: 137 mEq/L (ref 135–145)

## 2012-09-20 LAB — CBC
HCT: 33.9 % — ABNORMAL LOW (ref 39.0–52.0)
Hemoglobin: 11.6 g/dL — ABNORMAL LOW (ref 13.0–17.0)
MCH: 31.5 pg (ref 26.0–34.0)
MCHC: 34.2 g/dL (ref 30.0–36.0)
MCV: 92.1 fL (ref 78.0–100.0)
RBC: 3.68 MIL/uL — ABNORMAL LOW (ref 4.22–5.81)

## 2012-09-20 LAB — TROPONIN I: Troponin I: <0.3 ng/mL (ref <0.30)

## 2012-09-20 MED ORDER — SODIUM CHLORIDE 0.9 % IV BOLUS (SEPSIS)
500.0000 mL | Freq: Once | INTRAVENOUS | Status: AC
  Administered 2012-09-20: 500 mL via INTRAVENOUS

## 2012-09-20 MED FILL — Naphazoline-Glycerin Ophth Soln 0.012-0.2%: OPHTHALMIC | Qty: 15 | Status: AC

## 2012-09-20 NOTE — Progress Notes (Signed)
RRT team paged, Patient had a syncopal episode while working with therapy to get to commode.  Upon arrival to patients room, patient sitting on commode, patient alert, c/o dizziness, sweaty and nausea.  cool wet wash lcoth placed on head, patient states is starting to feel better.  Patient assisted back to bed by therapist.  RN to call if assistance needed

## 2012-09-20 NOTE — Progress Notes (Signed)
Rapid Response nurse called due to syncopal episode. Patient was transferred to [facility] commode and seated safely. Patient stated he felt like he was going to faint. Patient was unresponsive for approximately 5 seconds. Patient alert and oriented immediately after syncopal episode, and could state name, birthday, year, and time of day. Vital signs before syncope were  BP 119/93, pulse 99, temp 97.8, and respirations 20, oxygen 97 on 2L. After syncopal episode, BP 79/47 and pulse 58. PA notified of patient condition. Rapid response nurse did not have any further instructions for RN.

## 2012-09-20 NOTE — Progress Notes (Signed)
Physical Therapy Treatment Patient Details Name: Mark Berger MRN: [id number] DOB: 10/18/1924 Today's Date: 09/20/2012 Time:8:45  - 9:30    09/20/12 0800  PT Visit Information  Last PT Received On 09/20/12  PT Time Calculation  PT Start Time 0845  PT Stop Time 0930  PT Time Calculation (min) 45 min  Subjective Data  Subjective Pt talking constantly. Barely able to provide pt with instructions.    Patient Stated Goal To walk and get stronger.  Precautions  Precautions Knee;Fall  Precaution Booklet Issued Yes (comment)  Precaution Comments Reviewed precautions  Required Braces or Orthoses Knee Immobilizer - Right  Knee Immobilizer - Right On except when in CPM;Discontinue post op day 2  Restrictions  Weight Bearing Restrictions Yes  Cognition  Overall Cognitive Status Impaired  Area of Impairment Safety/judgement;Memory;Problem solving  Arousal/Alertness Awake/alert  Orientation Level Appears intact for tasks assessed  Behavior During Session Restless  Memory Deficits Repeating same questions multiple times.  Safety/Judgement Impulsive;Decreased awareness of need for assistance  Bed Mobility  Bed Mobility Supine to Sit;Sit to Supine  Supine to Sit 4: Min assist;HOB flat  Sit to Supine 3: Mod assist;With rail;HOB elevated  Details for Bed Mobility Assistance Verbal cues for technique.  Assist to move RLE on/off bed.   Transfers  Transfers Sit to Stand;Stand to Sit  Sit to Stand 3: Mod assist;With upper extremity assist;From elevated surface;From bed  Stand to Sit 3: Mod assist;To bed;With upper extremity assist  Details for Transfer Assistance Cues for hand placement.  Assist to initiate standing secondary to LE weakness.  Pt presents with R Knee flexion (despite KI) in standing.  Pt only able to stand for a few seconds before impulsively sitting back on the bed secondary to c/o dizzinesss.  Attempted a second time with similar result.  Returned pt to supine secondary to c/o  worsening dizziness.       Ambulation/Gait  Ambulation/Gait Assistance Not tested (comment)  Exercises  Exercises Total Joint  Total Joint Exercises  Quad Sets Right;5 reps;Supine  Straight Leg Raises 5 reps;Right;AAROM;Supine  PT - End of Session  Equipment Utilized During Treatment Gait belt;Oxygen;Right knee immobilizer  Activity Tolerance Patient limited by pain;Patient limited by fatigue;Treatment limited secondary to medical complications (Comment) (Dizziness)  Patient left in bed;in CPM;with call bell/phone within reach  Nurse Communication Mobility status  PT - Assessment/Plan  Comments on Treatment Session Pt unable to tolerate standing for more than a few seconds secondary to dizziness.  Placed pt in cpm at 9:30 am  0-60 degrees.     PT Plan Discharge plan remains appropriate;Frequency remains appropriate  PT Frequency 7X/week  Follow Up Recommendations SNF  Equipment Recommended None recommended by PT  Acute Rehab PT Goals  PT Goal Formulation With patient  Time For Goal Achievement 10/03/12  Potential to Achieve Goals Good  Pt will go Supine/Side to Sit with supervision;with HOB 0 degrees  PT Goal: Supine/Side to Sit - Progress Progressing toward goal  Pt will go Sit to Supine/Side with supervision;with HOB 0 degrees  PT Goal: Sit to Supine/Side - Progress Progressing toward goal  Pt will go Sit to Stand with supervision;with upper extremity assist  PT Goal: Sit to Stand - Progress Progressing toward goal  Pt will go Stand to Sit with supervision;with upper extremity assist  PT Goal: Stand to Sit - Progress Progressing toward goal  Pt will Transfer Bed to Chair/Chair to Bed with supervision  PT Transfer Goal: Bed to Chair/Chair to Bed -  Progress Progressing toward goal  PT General Charges  $$ ACUTE PT VISIT 1 Procedure  PT Treatments  $Therapeutic Activity 38-52 mins    Mark Berger 09/20/2012, 1:55 PM

## 2012-09-20 NOTE — Progress Notes (Signed)
Patient ID: Mark Berger, male   DOB: [birth year], 78 y.o.   MRN: [id number] PATIENT ID: Mark Berger  MRN: [id number]  DOB/AGE:  Oct 22, 1924 / 78 y.o.  1 Day Post-Op Procedure(s) (LRB): TOTAL KNEE ARTHROPLASTY (Right)    PROGRESS NOTE Subjective: Patient is alert, oriented, no Nausea, no Vomiting, yes passing gas, no Bowel Movement. Taking PO well. Denies SOB, Chest or Calf Pain. Using Incentive Spirometer, PAS in place. Ambulate not yet, CPM 0-60 Patient reports pain as 6 on 0-10 scale  .    Objective: Vital signs in last 24 hours: Filed Vitals:   09/20/12 0348 09/20/12 0624 09/20/12 1015 09/20/12 1030  BP:  124/70 119/93 79/47  Pulse:  82 99 58  Temp:  99 F (37.2 C) 97.8 F (36.6 C)   TempSrc:  Oral Oral Oral  Resp: 16 16 20 18   SpO2: 99% 97%        Intake/Output from previous day: I/O last 3 completed shifts: In: 1320 [P.O.:120; I.V.:1200] Out: 5000 [Urine:4650; Drains:350]   Intake/Output this shift:     LABORATORY DATA:  Basename 09/20/12 0515  WBC 10.7*  HGB 11.6*  HCT 33.9*  PLT 190  NA 137  K 4.1  CL 105  CO2 24  BUN 10  CREATININE 0.66  GLUCOSE 144*  GLUCAP --  INR --  CALCIUM 8.7    Examination: ABD soft Neurovascular intact Sensation intact distally Intact pulses distally Dorsiflexion/Plantar flexion intact Incision: dressing C/D/I tachycardia with pulse in the 120's}  Assessment:   1 Day Post-Op Procedure(s) (LRB): TOTAL KNEE ARTHROPLASTY (Right) ADDITIONAL DIAGNOSIS:   Active Problems:  Trigeminal neuralgia  Osteoarthritis of knee  Urinary tract infection  Benign prostate hyperplasia  Chronic low back pain  Arthritis  Right knee DJD  Anxiety  Boil on right hamstring  MRSA (methicillin resistant Staphylococcus aureus) carrier   Plan: PT/OT WBAT, CPM 5/hrs day until ROM 0-90 degrees, then D/C CPM DVT Prophylaxis:  SCDx72hrs, ASA 325 mg BID x 2 weeks DISCHARGE PLAN: Skilled Nursing Facility/Rehab DISCHARGE NEEDS: needs  FL2 on chart. Fluid bolus times 2 for hypotension.  Saline lock IV at 6 pm.  Dressing change after 7 pm.    Julien Girt J 09/20/2012, 11:27 AM  2

## 2012-09-20 NOTE — Progress Notes (Signed)
Physical Therapy Treatment Patient Details Name: Mark Berger MRN: [id number] DOB: 08/26/1924 Today's Date: 09/20/2012 Time: 0272-5366 PT Time Calculation (min): 32 min  PT Assessment / Plan / Recommendation Comments on Treatment Session  Pt continues to struggle with mobility.  Pt requires constant cueing to stay on task as he is easily distracted.  Enforced to pt that he must concentrate on the task at hand. While I was in mid sentence pt began to talk about not having had a bowel movement.  Reiterated to pt to "stay on task".      Follow Up Recommendations  SNF     Does the patient have the potential to tolerate intense rehabilitation     Barriers to Discharge        Equipment Recommendations  None recommended by PT    Recommendations for Other Services    Frequency 7X/week   Plan Discharge plan remains appropriate;Frequency remains appropriate    Precautions / Restrictions Precautions Precautions: Knee;Fall Precaution Booklet Issued: Yes (comment) Precaution Comments: Reviewed precautions Required Braces or Orthoses: Knee Immobilizer - Right Knee Immobilizer - Right: On except when in CPM;Discontinue post op day 2 Restrictions Weight Bearing Restrictions: Yes RLE Weight Bearing: Weight bearing as tolerated   Pertinent Vitals/Pain Pt reporting pain in knee 4-5/10     Mobility  Bed Mobility Bed Mobility: Not assessed Transfers Transfers: Sit to Stand;Stand to Sit (Multiple trials of each) Sit to Stand: 3: Mod assist;With upper extremity assist;From chair/3-in-1;With armrests (3 trials) Stand to Sit: 3: Mod assist;To chair/3-in-1;With upper extremity assist Details for Transfer Assistance: Assist to initiate standing secondary to bilateral LE weakness.  Assist to control descent to chair.  Pt has tendency to "crash land"  into the chair. Pt required assist to postiion R LE  to allow pt to sit without falling into the chair.  Performance improved with each trial.    Ambulation/Gait Ambulation/Gait Assistance: 2: Max assist Ambulation Distance (Feet): 4 Feet (two trials 4 feet each.  ) Assistive device: Rolling walker Ambulation/Gait Assistance Details: Max assist to advance R LE.  Repeated/constant verbal and tactile cueing for sequencing as pt consistently advancing L LE prior to RW or R LE.  Pt presents with flexed knee posture bilaterally.  Pt unable to reach full knee extension in standing with either LE.   Pt's L knee began to make a grinding/clicking noise while attempting to advance R LE.   Gait Pattern: Step-to pattern;Decreased stride length;Antalgic;Right flexed knee in stance;Left flexed knee in stance;Ataxic;Decreased weight shift to right Stairs: No    Exercises     PT Diagnosis:    PT Problem List:   PT Treatment Interventions:     PT Goals Acute Rehab PT Goals PT Goal Formulation: With patient Time For Goal Achievement: 10/03/12 Potential to Achieve Goals: Good Pt will go Supine/Side to Sit: with supervision;with HOB 0 degrees Pt will go Sit to Supine/Side: with supervision;with HOB 0 degrees Pt will go Sit to Stand: with supervision;with upper extremity assist PT Goal: Sit to Stand - Progress: Progressing toward goal Pt will go Stand to Sit: with supervision;with upper extremity assist PT Goal: Stand to Sit - Progress: Progressing toward goal Pt will Transfer Bed to Chair/Chair to Bed: with supervision PT Transfer Goal: Bed to Chair/Chair to Bed - Progress: Progressing toward goal Pt will Ambulate: 51 - 150 feet;with supervision;with rolling walker PT Goal: Ambulate - Progress: Progressing toward goal Pt will Perform Home Exercise Program: with min assist PT Goal: Perform Home  Exercise Program - Progress: Not met  Visit Information  Last PT Received On: 09/20/12 Assistance Needed: +2    Subjective Data      Cognition  Overall Cognitive Status: Impaired Area of Impairment: Safety/judgement;Memory;Problem  solving Arousal/Alertness: Awake/alert Orientation Level: Appears intact for tasks assessed Behavior During Session: Restless Memory Deficits: Repeating same questions multiple times. Safety/Judgement: Impulsive;Decreased awareness of need for assistance    Balance  Balance Balance Assessed: No  End of Session PT - End of Session Equipment Utilized During Treatment: Gait belt;Oxygen;Right knee immobilizer Activity Tolerance: Patient limited by pain;Patient limited by fatigue;Treatment limited secondary to medical complications (Comment) Patient left: in chair;with call bell/phone within reach Nurse Communication: Mobility status   GP     Dinna Severs 09/20/2012, 6:41 PM [place] Kenley L. Griffen Frayne DPT [phone]

## 2012-09-20 NOTE — Progress Notes (Signed)
Patient ID: DMONTE GREENLY, male   DOB: [birth year], 78 y.o.   MRN: [id number] Called by  Marjory Sneddon RN.  Patient faited while sitting on the bedside commode.  Rapid response called   No fall.  Hypotensive 79/47.  Unconscious for 5-10 sec.  Will order cardiac enzymes and consult Cardiologist.  Claron Rosencrans A. Gwinda Passe Physician Assistant [facility] [phone]  09/20/2012, 11:23 AM

## 2012-09-20 NOTE — Progress Notes (Signed)
OT Cancellation Note  Patient Details Name: Mark Berger MRN: [id number] DOB: 1924/07/17   Cancelled Treatment:    Reason Eval/Treat Not Completed: Patient at procedure or test/ unavailable;Other (comment) (working with PT) Will eval in am. [facility], OTR/L  [phone] 09/20/2012  [name],HILLARY 09/20/2012, 4:53 PM

## 2012-09-20 NOTE — Progress Notes (Signed)
Physical Therapy Treatment Patient Details Name: Mark Berger MRN: [id number] DOB: [birth year] Today's Date: 09/20/2012 Time: 4098-1191 PT Time Calculation (min): 25 min  PT Assessment / Plan / Recommendation Comments on Treatment Session  Returned to pt's room at request of nursing to find pt on bedside commode vomiting.  RN reports pt had a syncopal episodes on the bedside commode.  Pt was alert and "talking" upon my arrival.  BP was low 78/47. Returned pt to bed and left in care of RN. Pt asking questions about his therapy throughout the session.       Follow Up Recommendations  SNF     Does the patient have the potential to tolerate intense rehabilitation     Barriers to Discharge        Equipment Recommendations  None recommended by PT    Recommendations for Other Services    Frequency 7X/week   Plan Discharge plan remains appropriate;Frequency remains appropriate    Precautions / Restrictions Precautions Precautions: Knee;Fall Precaution Booklet Issued: Yes (comment) Precaution Comments: Reviewed precautions Required Braces or Orthoses: Knee Immobilizer - Right Knee Immobilizer - Right: On except when in CPM;Discontinue post op day 2 Restrictions Weight Bearing Restrictions: Yes RLE Weight Bearing: Weight bearing as tolerated   Pertinent Vitals/Pain Pt with no c/o pain in knee.      Mobility  Bed Mobility Bed Mobility: Sit to Supine;Scooting to HOB Supine to Sit: Not tested (comment) Sit to Supine: 3: Mod assist;With rail;HOB elevated Scooting to HOB: 4: Min assist;With trapeze Details for Bed Mobility Assistance: Assist for Bilateral LE management.  Transfers Transfers: Sit to Stand;Stand to [employer] Transfers Sit to Stand: 2: Max assist;From chair/3-in-1;With upper extremity assist Stand to Sit: 2: Max assist;To bed Stand Pivot Transfers: 1: +1 Total assist Details for Transfer Assistance: Performed stand pivot transfer without RW secondary to pt  c/o worsening dizziness.  See comment on session.  Ambulation/Gait Ambulation/Gait Assistance: Not tested (comment)    Exercises       PT Goals Acute Rehab PT Goals PT Goal Formulation: With patient Time For Goal Achievement: 10/03/12 Potential to Achieve Goals: Good Pt will go Supine/Side to Sit: with supervision;with HOB 0 degrees Pt will go Sit to Supine/Side: with supervision;with HOB 0 degrees PT Goal: Sit to Supine/Side - Progress: Not met Pt will go Sit to Stand: with supervision;with upper extremity assist PT Goal: Sit to Stand - Progress: Not met Pt will go Stand to Sit: with supervision;with upper extremity assist PT Goal: Stand to Sit - Progress: Not met Pt will Transfer Bed to Chair/Chair to Bed: with supervision PT Transfer Goal: Bed to Chair/Chair to Bed - Progress: Progressing toward goal  Visit Information  Last PT Received On: 09/20/12 Assistance Needed: +2    Subjective Data  Subjective: Pt talking constantly. Barely able to provide pt with instructions.   Patient Stated Goal: to get back to bed   Cognition  Overall Cognitive Status: Impaired Area of Impairment: Safety/judgement;Memory;Problem solving Arousal/Alertness: Awake/alert Orientation Level: Appears intact for tasks assessed Behavior During Session: Restless Memory Deficits: Repeating same questions multiple times. Safety/Judgement: Impulsive;Decreased awareness of need for assistance    Balance     End of Session PT - End of Session Equipment Utilized During Treatment: Gait belt;Oxygen;Right knee immobilizer Activity Tolerance: Patient limited by pain;Patient limited by fatigue;Treatment limited secondary to medical complications (Comment) Patient left: in bed;with call bell/phone within reach;with nursing in room Nurse Communication: Mobility status   GP  Mark Berger 09/20/2012, 2:11 PM

## 2012-09-21 ENCOUNTER — Encounter (HOSPITAL_COMMUNITY): Payer: Self-pay | Admitting: General Practice

## 2012-09-21 LAB — CBC
MCV: 92 fL (ref 78.0–100.0)
Platelets: 180 10*3/uL (ref 150–400)
RBC: 3.11 MIL/uL — ABNORMAL LOW (ref 4.22–5.81)
RDW: 12.8 % (ref 11.5–15.5)

## 2012-09-21 LAB — BASIC METABOLIC PANEL
Chloride: 106 mEq/L (ref 96–112)
Creatinine, Ser: 0.69 mg/dL (ref 0.50–1.35)
GFR calc Af Amer: >90 mL/min (ref >90)
Potassium: 4.2 mEq/L (ref 3.5–5.1)

## 2012-09-21 MED ORDER — ALPRAZOLAM 0.5 MG PO TABS
1.0000 mg | ORAL_TABLET | Freq: Three times a day (TID) | ORAL | Status: DC | PRN
  Administered 2012-09-21 – 2012-09-22 (×2): 1 mg via ORAL
  Filled 2012-09-21 (×2): qty 2

## 2012-09-21 MED ORDER — LORAZEPAM 1 MG PO TABS
1.0000 mg | ORAL_TABLET | Freq: Every day | ORAL | Status: DC
  Administered 2012-09-21: 1 mg via ORAL
  Filled 2012-09-21: qty 1

## 2012-09-21 NOTE — Progress Notes (Signed)
Physical Therapy Treatment Patient Details Name: Mark Berger MRN: [id number] DOB: [birth year] Today's Date: 09/21/2012 Time: 4098-1191 PT Time Calculation (min): 31 min  PT Assessment / Plan / Recommendation Comments on Treatment Session  Pt. progressing with PT goals and mobility since morning session being able to ambulate out of his room and into hallway today. Pt. still with max VC for staying on task, focusing on commands being given, and for technique. Pt. consistently sitting without reaching for surface to be sat on and is easily distracted and talks throughout session. Pt. tolerated 10 degree increase in CPM, now at 70degrees Rt knee flexion in CPM.    Follow Up Recommendations  SNF     Does the patient have the potential to tolerate intense rehabilitation     Barriers to Discharge        Equipment Recommendations  None recommended by PT    Recommendations for Other Services    Frequency 7X/week   Plan Discharge plan remains appropriate;Frequency remains appropriate    Precautions / Restrictions Precautions Precautions: Knee;Fall Precaution Comments: Reviewed precautions Required Braces or Orthoses: Knee Immobilizer - Right Knee Immobilizer - Right: On except when in CPM;Discontinue post op day 2 Restrictions Weight Bearing Restrictions: Yes RLE Weight Bearing: Weight bearing as tolerated   Pertinent Vitals/Pain Patient rates pain in right knee 7/10 during PM PT session.    Mobility  Bed Mobility Bed Mobility: Sit to Supine Sit to Supine: 4: Min assist;HOB flat Details for Bed Mobility Assistance: Pt. min (A) with getting his Rt LE into the bed and positioned appropriately.  Transfers Transfers: Sit to Stand;Stand to Sit Sit to Stand: 4: Min assist;With upper extremity assist;With armrests;From chair/3-in-1 Stand to Sit: 4: Min assist;With upper extremity assist;With armrests;To chair/3-in-1 Details for Transfer Assistance: Pt. given max cues throughout  session for extending his Rt knee and for proper, safe hand placement on the chair when stepping to or sitting from RW.  Ambulation/Gait Ambulation/Gait Assistance: 4: Min assist Ambulation Distance (Feet): 100 Feet Assistive device: Rolling walker Ambulation/Gait Assistance Details: Pt. able to ambulate out into the hallway with RW and min (A). Max cuing given for posture and for advancing the Rt. LE first. Pt. consistently moving Lt. LE stating he was in the [employer] and SPTA aske pt. to call sequence outloud, which seemed to help with technique, gt. speed, and made for a more typical gt. pattern. Pt. staying in slightly croushed position with max cues throughout session. Gait Pattern: Step-to pattern;Step-through pattern;Decreased stride length;Right flexed knee in stance;Left flexed knee in stance;Trunk flexed Gait velocity: slow Stairs: No Wheelchair Mobility Wheelchair Mobility: No    Exercises Total Joint Exercises Ankle Circles/Pumps: AROM;Both;10 reps;Seated Quad Sets: AROM;Right;5 reps;Seated Heel Slides: AAROM;Right;10 reps;Supine;Seated (5 seated, 5 supine) Hip ABduction/ADduction: AAROM;Right;5 reps;Seated Straight Leg Raises: AAROM;Right;5 reps;Seated Long Arc Quad: AAROM;Right;5 reps;Seated     PT Goals Acute Rehab PT Goals PT Goal Formulation: With patient Time For Goal Achievement: 10/03/12 Potential to Achieve Goals: Good Pt will go Supine/Side to Sit: with supervision;with HOB 0 degrees Pt will go Sit to Supine/Side: with supervision;with HOB 0 degrees PT Goal: Sit to Supine/Side - Progress: Progressing toward goal Pt will go Sit to Stand: with supervision;with upper extremity assist PT Goal: Sit to Stand - Progress: Progressing toward goal Pt will go Stand to Sit: with supervision;with upper extremity assist PT Goal: Stand to Sit - Progress: Progressing toward goal Pt will Transfer Bed to Chair/Chair to Bed: with supervision Pt will Ambulate: 51 -  150 feet;with  supervision;with rolling walker PT Goal: Ambulate - Progress: Progressing toward goal Pt will Perform Home Exercise Program: with min assist PT Goal: Perform Home Exercise Program - Progress: Progressing toward goal  Visit Information  Last PT Received On: 09/21/12 Assistance Needed: +2 (To follow with recliner)    Subjective Data  Subjective: Pt. talking non stop through treatment.  Patient Stated Goal: To get leg better   Cognition  Overall Cognitive Status: Impaired Area of Impairment: Safety/judgement;Following commands;Problem solving Arousal/Alertness: Awake/alert Orientation Level: Appears intact for tasks assessed Behavior During Session: [facility] for tasks performed Memory Deficits: Repeating same questions multiple times. Safety/Judgement: Decreased awareness of need for assistance;Impulsive Cognition - Other Comments: Pt. given max cues throughout session to focus on tasks at hand for his safety.    Balance  Balance Balance Assessed: No  End of Session PT - End of Session Equipment Utilized During Treatment: Gait belt Activity Tolerance: Patient tolerated treatment well Patient left: in bed;in CPM;with call bell/phone within reach Nurse Communication: Mobility status    Mertie Clause, SPTA 09/21/2012, 3:02 PM

## 2012-09-21 NOTE — Progress Notes (Signed)
Physical Therapy Treatment Patient Details Name: AUDI HARLIN MRN: [id number] DOB: [birth year] Today's Date: 09/21/2012 Time: 0752-0823 PT Time Calculation (min): 31 min  PT Assessment / Plan / Recommendation Comments on Treatment Session  Pt progressing with PT goals & mobility as evident in requiring decreased (A) today, however pt cont's to require max directional cues for safe technique with mobility.  Pt also requires max cueing to stay focused to task as he is easily distracted & talks about other things.      Follow Up Recommendations  SNF     Does the patient have the potential to tolerate intense rehabilitation     Barriers to Discharge        Equipment Recommendations  None recommended by PT    Recommendations for Other Services    Frequency 7X/week   Plan Discharge plan remains appropriate;Frequency remains appropriate    Precautions / Restrictions Precautions Precautions: Knee;Fall Precaution Comments: Reviewed precautions Required Braces or Orthoses: Knee Immobilizer - Right Knee Immobilizer - Right: On except when in CPM;Discontinue post op day 2 Restrictions RLE Weight Bearing: Weight bearing as tolerated       Mobility  Bed Mobility Bed Mobility: Not assessed Transfers Transfers: Sit to Stand;Stand to Sit Sit to Stand: 4: Min assist;With upper extremity assist;With armrests;From chair/3-in-1 Stand to Sit: 4: Min assist;With upper extremity assist;To chair/3-in-1;With armrests Details for Transfer Assistance: max cues for safe hand placement Ambulation/Gait Ambulation/Gait Assistance: 4: Min assist (+2 to follow with recliner) Ambulation Distance (Feet): 25 Feet (15' + 10' ) Assistive device: Rolling walker Ambulation/Gait Assistance Details: Max cues for sequencing, tall posture.  Pt still consistently advancing LLE prior to RW or R LE, as well as bil knees flexed in stance phase.   Gait Pattern: Step-to pattern;Right flexed knee in stance;Left  flexed knee in stance;Decreased step length - right;Decreased step length - left;Trunk flexed Stairs: No    Exercises Total Joint Exercises Ankle Circles/Pumps: AROM;Both;10 reps Quad Sets: AROM;Right;10 reps Heel Slides: AAROM;Right;10 reps Hip ABduction/ADduction: AAROM;Right;10 reps Straight Leg Raises: AAROM;Right;10 reps     PT Goals Acute Rehab PT Goals Time For Goal Achievement: 10/03/12 Potential to Achieve Goals: Good Pt will go Supine/Side to Sit: with supervision;with HOB 0 degrees Pt will go Sit to Supine/Side: with supervision;with HOB 0 degrees Pt will go Sit to Stand: with supervision;with upper extremity assist PT Goal: Sit to Stand - Progress: Progressing toward goal Pt will go Stand to Sit: with supervision;with upper extremity assist PT Goal: Stand to Sit - Progress: Progressing toward goal Pt will Transfer Bed to Chair/Chair to Bed: with supervision Pt will Ambulate: 51 - 150 feet;with supervision;with rolling walker PT Goal: Ambulate - Progress: Progressing toward goal Pt will Perform Home Exercise Program: with min assist PT Goal: Perform Home Exercise Program - Progress: Progressing toward goal  Visit Information  Last PT Received On: 09/21/12 Assistance Needed: +2 (to follow with recliner)    Subjective Data      Cognition  Overall Cognitive Status: Impaired Area of Impairment: Safety/judgement;Following commands;Problem solving Arousal/Alertness: Awake/alert Orientation Level: Appears intact for tasks assessed Behavior During Session: [facility] for tasks performed Memory Deficits: Repeating same questions multiple times. Safety/Judgement: Impulsive;Decreased awareness of need for assistance    Balance  Balance Balance Assessed: No  End of Session PT - End of Session Equipment Utilized During Treatment: Gait belt Activity Tolerance: Patient tolerated treatment well Patient left: in chair;with call bell/phone within reach Nurse Communication:  Mobility status  Verdell Face, [place] [phone] 09/21/2012

## 2012-09-21 NOTE — Consult Note (Addendum)
CARDIOLOGY CONSULT NOTE  Patient ID: Mark Berger MRN: [id number] DOB/AGE: [birth year] 78 y.o.  Admit date: 09/19/2012 Referring Physician  Collier Salina, MD Primary Physician:  Gaspar Garbe, MD Reason for Consultation  Syncope   HPI: Patient had an episode of syncope yesterday when the nurse took him from the bed to the chair and tried to walk. Felt dizzy, warm and felt like he was going to pass out then had syncope for 8-10 seconds. Today he has been on his feet and underwent physical therapy without problems. Denies chest pain or dyspnea or neurologic deficiency. Telemetry showed NSR or sinus tachycardia. Asked to opine on his medical issues.   Past Medical History  Diagnosis Date  . Arthritis   . Trigeminal neuralgia   . Kidney calculi   . Right knee DJD   . Shortness of breath     exertion  . Anxiety   . Pneumonia     history of x3  . Urinary tract infection   . BPH (benign prostatic hyperplasia)      Past Surgical History  Procedure Date  . Hernia repair   . Hemorrhoid surgery   . Eye surgery     bilateral  . Total knee arthroplasty 09/19/2012    Procedure: TOTAL KNEE ARTHROPLASTY;  Surgeon: Nilda Simmer, MD;  Location: [facility] OR;  Service: Orthopedics;  Laterality: Right;  right total knee arthroplasty     History reviewed. No pertinent family history. No premature CAD in family.   Social History: History   Social History  . Marital Status: Single    Spouse Name: N/A    Number of Children: N/A  . Years of Education: N/A   Occupational History  . Not on file.   Social History Main Topics  . Smoking status: Never Smoker   . Smokeless tobacco: Not on file  . Alcohol Use: No  . Drug Use: No  . Sexually Active: Not Currently   Other Topics Concern  . Not on file   Social History Narrative  . No narrative on file     ROS: General: no fevers/chills/night sweats Eyes: no blurry vision, diplopia, or amaurosis ENT: no sore throat or hearing  loss Resp: no cough, wheezing, or hemoptysis CV: no edema or palpitations GI: no abdominal pain, nausea, vomiting, diarrhea, or constipation GU: no dysuria, frequency, or hematuria Skin: no rash Neuro: no focal neuro deficits.  Neuro: no headache, numbness, tingling, or weakness of extremities Musculoskeletal: no joint pain or swelling Heme: no bleeding, DVT, or easy bruising Endo: no polydipsia or polyuria    Physical Exam: Blood pressure 135/74, pulse 89, temperature 98 F (36.7 C), temperature source Oral, resp. rate 18, SpO2 98.00%.   General appearance: alert, cooperative, appears stated age and no distress Neck: no adenopathy, no carotid bruit, no JVD, supple, symmetrical, trachea midline and thyroid not enlarged, symmetric, no tenderness/mass/nodules Lungs: clear to auscultation bilaterally Heart: regular rate and rhythm, S1, S2 normal, no murmur, click, rub or gallop Abdomen: soft, non-tender; bowel sounds normal; no masses,  no organomegaly Extremities: Homans sign is negative, no sign of DVT and Right knee replacement noted.  Labs:   Lab Results  Component Value Date   WBC 11.2* 09/21/2012   HGB 9.9* 09/21/2012   HCT 28.6* 09/21/2012   MCV 92.0 09/21/2012   PLT 180 09/21/2012    Lab 09/21/12 0500  NA 138  K 4.2  CL 106  CO2 24  BUN 17  CREATININE 0.69  CALCIUM  8.5  PROT --  BILITOT --  ALKPHOS --  ALT --  AST --  GLUCOSE 128*   Lab Results  Component Value Date   CKTOTAL 39 12/19/2011   CKMB 1.7 12/19/2011   TROPONINI <0.30 09/21/2012    Lipid Panel  No results found for this basename: chol, trig, hdl, cholhdl, vldl, ldlcalc    EKG: sinus tachycardia.    Radiology: No results found.    ASSESSMENT AND PLAN:  1. Syncope appears to be vasovagal than arrhythmogenic or ischemic. Stress test 08/02/12: . Perfusion imaging study demonstrates normal perfusion without ischemia or scar. Normal left ventricular ejection fraction.  2.  Hypertension  Rec: From cardiac standpoint, no further evaluation indicated. I discussed with the RN who was the witness to the event. No seizure activity. Resume PT as per ortho.  Echo shows Normal LVEF with mild diastolic dysfunction. Telemetry showed no heart block or SVT. I will be happy to follow up in the op basis but also please do not hesitate to call me if questions.  Pamella Pert, MD 09/21/2012, 10:49 PM [facility] Pager: [phone] Office: [phone] If no answer Cell [phone]

## 2012-09-21 NOTE — Clinical Social Work Psychosocial (Addendum)
    Clinical Social Work Department BRIEF PSYCHOSOCIAL ASSESSMENT 09/21/2012  Patient:  Mark Berger, Mark Berger     Account Number:  [id number]     Admit date:  09/19/2012  Clinical Social Worker:  Tiburcio Pea  Date/Time:  09/20/2012 01:00 PM  Referred by:  Physician  Date Referred:  09/21/2012 Referred for  SNF Placement   Other Referral:   Interview type:  Patient Other interview type:    PSYCHOSOCIAL DATA Living Status:  ALONE Admitted from facility:   Level of care:   Primary support name:  Fernande Bras Primary support relationship to patient:  FRIEND Degree of support available:   Good support    CURRENT CONCERNS Current Concerns  Post-Acute Placement   Other Concerns:    SOCIAL WORK ASSESSMENT / PLAN Met with patient today to discuss need for short term SNF. Patient related that he has pre-arranged placement at [facility] and he will go there for short term rehab. He lives alone and does not have any family in [place]. Patient is alert, oriented and very friendly.  He is somewhat hard of hearing.  Fl2 placed on chart for MD's signature. Spoke to [employer]- admissions at [facility].  Bed will be available for patient when stable. Plan d/c to SNF on 09/22/12 per Fritzi Mandes, [place].   Assessment/plan status:  Psychosocial Support/Ongoing Assessment of Needs Other assessment/ plan:   Information/referral to community resources:   SNF list deferred as he has pre-chosen [facility]    After care needs discussed- to be arranged by SNF prior to d/c home    PATIENT'S/FAMILY'S RESPONSE TO PLAN OF CARE: Patient is alert, oriented and very pleasant.  He is extremely talkative and meticulous with making sure all details have been arranged for his d/c to SNF. He was very appreciative of CSW's assistance.  Patient verbalized several concerns regarding in-room visits the day of surgery by the rep of [employer] and Genevieve Norlander-- he felt that they should have waited until the day  after surgery. This feedback was forwarded to [facility]- Vance Peper and Anette Guarneri.

## 2012-09-21 NOTE — Clinical Social Work Placement (Addendum)
    Clinical Social Work Department CLINICAL SOCIAL WORK PLACEMENT NOTE 09/21/2012  Patient:  Mark Berger, Mark Berger  Account Number:  [id number] Admit date:  09/19/2012  Clinical Social Worker:  Lupita Leash Epiphany Seltzer, LCSWA  Date/time:  09/20/2012 01:45 PM  Clinical Social Work is seeking post-discharge placement for this patient at the following level of care:   SKILLED NURSING   (*CSW will update this form in Epic as items are completed)     Patient/family provided with [name] Department of Clinical Social Work's list of facilities offering this level of care within the geographic area requested by the patient (or if unable, by the patient's family).    Patient/family informed of their freedom to choose among providers that offer the needed level of care, that participate in Medicare, Medicaid or managed care program needed by the patient, have an available bed and are willing to accept the patient.    Patient/family informed of [facility]' ownership interest in [facility], as well as of the fact that they are under no obligation to receive care at this facility.  PASARR submitted to EDS on 09/20/2012 PASARR number received from EDS on 09/20/2012  FL2 transmitted to all facilities in geographic area requested by pt/family on  09/20/2012 FL2 transmitted to all facilities within larger geographic area on   Patient informed that his/her managed care company has contracts with or will negotiate with  certain facilities, including the following:   [employer]- Merrie Roof- [facility]     Patient/family informed of bed offers received:  09/20/2012 Patient chooses bed at [facility] Physician recommends and patient chooses bed at    Patient to be transferred to [facility] on  09/22/2012 Patient to be transferred to facility by Ambulance  The following physician request were entered in Epic:   Additional Comments: Patient is pleased with dc plan and states he feels ready to go  to SNF tomorrow.  Lorri Frederick. [place]  [phone]  09/22/12  OK for d/c today.  Pt, SNF and nurse Corrie Dandy were notified of d/c.  Lorri Frederick. [place]  [phone]

## 2012-09-21 NOTE — Progress Notes (Signed)
Patient ID: Mark Berger, male   DOB: 08-Mar-1924, 78 y.o.   MRN: [id number] PATIENT ID: Mark Berger  MRN: [id number]  DOB/AGE:  12/19/1923 / 78 y.o.  2 Days Post-Op Procedure(s) (LRB): TOTAL KNEE ARTHROPLASTY (Right)    PROGRESS NOTE Subjective: Patient is alert, oriented, no Nausea, no Vomiting, yes passing gas, no Bowel Movement. Taking PO well. Denies SOB, Chest or Calf Pain. Using Incentive Spirometer, PAS in place. Ambulate very unsafe movement, CPM 0-60 Patient reports pain as 4 on 0-10 scale  .    Objective: Vital signs in last 24 hours: Filed Vitals:   09/20/12 2000 09/20/12 2130 09/21/12 0000 09/21/12 0608  BP:  151/84  142/74  Pulse:  100  95  Temp:  98.1 F (36.7 C)  98 F (36.7 C)  TempSrc:      Resp: 18 18 18 18   SpO2: 99% 99% 99% 99%      Intake/Output from previous day: I/O last 3 completed shifts: In: 3061.7 [P.O.:360; I.V.:2701.7] Out: 4850 [Urine:4500; Drains:350]   Intake/Output this shift:     LABORATORY DATA:  Basename 09/21/12 0500 09/20/12 0515  WBC 11.2* 10.7*  HGB 9.9* 11.6*  HCT 28.6* 33.9*  PLT 180 190  NA 138 137  K 4.2 4.1  CL 106 105  CO2 24 24  BUN 17 10  CREATININE 0.69 0.66  GLUCOSE 128* 144*  GLUCAP -- --  INR -- --  CALCIUM 8.5 --    Examination: ABD soft Neurovascular intact Sensation intact distally Intact pulses distally Dorsiflexion/Plantar flexion intact Incision: scant drainage}  Assessment:   2 Days Post-Op Procedure(s) (LRB): TOTAL KNEE ARTHROPLASTY (Right) ADDITIONAL DIAGNOSIS:  Principal Problem:  *Right knee DJD Active Problems:  Syncope  Trigeminal neuralgia  Osteoarthritis of knee  Urinary tract infection  Benign prostate hyperplasia  Chronic low back pain  Arthritis  Anxiety  Boil on right hamstring  MRSA (methicillin resistant Staphylococcus aureus) carrier   Plan: PT/OT WBAT, CPM 5/hrs day until ROM 0-90 degrees, then D/C CPM DVT Prophylaxis:  SCDx72hrs, lovenox bid for 14  days DISCHARGE PLAN: Skilled Nursing Facility/Rehab DISCHARGE NEEDS: to [facility] today or tomorrow     Tomoki Lucken J 09/21/2012, 7:29 AM

## 2012-09-21 NOTE — Progress Notes (Signed)
OT PROGRESS NOTE  Pt to D/C to SNF today or tomorrow. Will defer OT to SNF. If OT needed for SNF admission, please reorder.  [facility], OTR/L  [phone] 09/21/2012

## 2012-09-22 LAB — BASIC METABOLIC PANEL
BUN: 20 mg/dL (ref 6–23)
Chloride: 106 mEq/L (ref 96–112)
GFR calc Af Amer: >90 mL/min (ref >90)
Potassium: 4.4 mEq/L (ref 3.5–5.1)
Sodium: 138 mEq/L (ref 135–145)

## 2012-09-22 LAB — CBC
HCT: 26.6 % — ABNORMAL LOW (ref 39.0–52.0)
Hemoglobin: 9.2 g/dL — ABNORMAL LOW (ref 13.0–17.0)
MCH: 32.6 pg (ref 26.0–34.0)
MCHC: 34.6 g/dL (ref 30.0–36.0)
MCV: 94.3 fL (ref 78.0–100.0)
Platelets: 163 K/uL (ref 150–400)
RBC: 2.82 MIL/uL — ABNORMAL LOW (ref 4.22–5.81)
RDW: 13.2 % (ref 11.5–15.5)
WBC: 8.4 K/uL (ref 4.0–10.5)

## 2012-09-22 MED ORDER — ALPRAZOLAM 1 MG PO TABS: 1.0000 mg | ORAL_TABLET | Freq: Three times a day (TID) | ORAL | Status: DC | PRN

## 2012-09-22 MED ORDER — ENOXAPARIN SODIUM 30 MG/0.3ML ~~LOC~~ SOLN: 30.0000 mg | Freq: Two times a day (BID) | SUBCUTANEOUS | Status: DC

## 2012-09-22 MED ORDER — DSS 100 MG PO CAPS: 100.0000 mg | ORAL_CAPSULE | Freq: Two times a day (BID) | ORAL | Status: DC

## 2012-09-22 MED ORDER — LORAZEPAM 1 MG PO TABS: 0.5000 mg | ORAL_TABLET | Freq: Every evening | ORAL | Status: DC | PRN

## 2012-09-22 MED ORDER — DOXYCYCLINE HYCLATE 100 MG PO TABS: 100.0000 mg | ORAL_TABLET | Freq: Two times a day (BID) | ORAL | Status: DC

## 2012-09-22 MED ORDER — CELECOXIB 200 MG PO CAPS: 200.0000 mg | ORAL_CAPSULE | Freq: Every day | ORAL | Status: DC

## 2012-09-22 MED ORDER — ACETAMINOPHEN 325 MG PO TABS: 650.0000 mg | ORAL_TABLET | ORAL | Status: DC | PRN

## 2012-09-22 NOTE — Discharge Summary (Signed)
Patient ID: Mark Berger MRN: [id number] DOB/AGE: 09-Mar-1924 78 y.o.  Admit date: 09/19/2012 Discharge date: 09/22/2012  Admission Diagnoses:  Principal Problem:  *Right knee DJD Active Problems:  Syncope  Trigeminal neuralgia  Osteoarthritis of knee  Urinary tract infection  Benign prostate hyperplasia  Chronic low back pain  Arthritis  Anxiety  BPH (benign prostatic hyperplasia)  Boil on right hamstring  MRSA (methicillin resistant Staphylococcus aureus) carrier   Discharge Diagnoses:  Same  Past Medical History  Diagnosis Date  . Arthritis   . Trigeminal neuralgia   . Kidney calculi   . Right knee DJD   . Shortness of breath     exertion  . Anxiety   . Pneumonia     history of x3  . Urinary tract infection   . BPH (benign prostatic hyperplasia)     Surgeries: Procedure(s): TOTAL KNEE ARTHROPLASTY on 09/19/2012   Consultants: Treatment Team:  Pamella Pert, MD  Discharged Condition: Improved  Hospital Course: Mark Berger is an 78 y.o. male who was admitted 09/19/2012 for operative treatment ofRight knee DJD. Patient has severe unremitting pain that affects sleep, daily activities, and work/hobbies. After pre-op clearance the patient was taken to the operating room on 09/19/2012 and underwent  Procedure(s): TOTAL KNEE ARTHROPLASTY.  While applying the tourniquet to the operative leg a large boil was noticed on the posterior aspect of the leg.  It was red swollen and firm.  With this finding, Dr Thurston Hole elected to proceed with the total knee replacement since there was no open wound. He felt it was medically necessary to aggressively treat this infection with combination IV antibiotics preoperatively as well as postoperatively throughout his hospital stay.  The patient had an elevated white count for the first 2 post operative days however on the 3 day it trended down to within normal limits.  He was treated with IV Vancomycin and IV Ancef throughout  his hospital stay due to this boil on the back of his right leg.  He is being discharged on Doxycycline 100 mg PO BID for 30 days in hopes of completely resolve this recurring boil.  Patient was given perioperative antibiotics:     Anti-infectives     Start     Dose/Rate Route Frequency Ordered Stop   09/22/12 0000   doxycycline (VIBRA-TABS) 100 MG tablet        100 mg Oral 2 times daily 09/22/12 0742     09/19/12 1800   vancomycin (VANCOCIN) IVPB 1000 mg/200 mL premix        1,000 mg 200 mL/hr over 60 Minutes Intravenous Every 12 hours 09/19/12 1335     09/19/12 1500   ceFAZolin (ANCEF) IVPB 2 g/50 mL premix        2 g 100 mL/hr over 30 Minutes Intravenous 3 times per day 09/19/12 1406     09/19/12 1330   vancomycin (VANCOCIN) IVPB 1000 mg/200 mL premix  Status:  Discontinued        1,000 mg 200 mL/hr over 60 Minutes Intravenous Every 12 hours 09/19/12 1325 09/19/12 1335   09/19/12 1009   cefUROXime (ZINACEF) injection  Status:  Discontinued          As needed 09/19/12 0939 09/19/12 1110   09/19/12 1000   vancomycin (VANCOCIN) 1,000 mg in sodium chloride 0.9 % 250 mL IVPB  Status:  Discontinued        1,000 mg 250 mL/hr over 60 Minutes Intravenous Every 12 hours 09/19/12 0925  09/19/12 1335   09/18/12 1519   ceFAZolin (ANCEF) IVPB 2 g/50 mL premix        2 g 100 mL/hr over 30 Minutes Intravenous 60 min pre-op 09/18/12 1519 09/19/12 1610           Patient was given IV antibiotics, sequential compression devices, early ambulation, and chemoprophylaxis to prevent DVT.  Patient benefited maximally from hospital stay and there were no other complications.    Recent vital signs:  Patient Vitals for the past 24 hrs:  BP Temp Temp src Pulse Resp SpO2  09/22/12 0550 129/50 mmHg 98.1 F (36.7 C) Oral 87  22  96 %  09/21/12 2200 135/74 mmHg 98 F (36.7 C) Oral 89  18  98 %  09/21/12 1600 - - - - 17  96 %  09/21/12 1451 143/86 mmHg 98.4 F (36.9 C) - 107  18  96 %  09/21/12  1200 - - - - 18  94 %  09/21/12 0800 - - - - 18  98 %     Recent laboratory studies:   Basename 09/22/12 0600 09/21/12 0500 09/20/12 0515  WBC 8.4 11.2* --  HGB 9.2* 9.9* --  HCT 26.6* 28.6* --  PLT 163 180 --  NA -- 138 137  K -- 4.2 4.1  CL -- 106 105  CO2 -- 24 24  BUN -- 17 10  CREATININE -- 0.69 0.66  GLUCOSE -- 128* 144*  INR -- -- --  CALCIUM -- 8.5 --     Discharge Medications:     Medication List     As of 09/22/2012  7:51 AM    STOP taking these medications         omega-3 acid ethyl esters 1 G capsule   Commonly known as: LOVAZA      TAKE these medications         acetaminophen 325 MG tablet   Commonly known as: TYLENOL   Take 2 tablets (650 mg total) by mouth every 4 (four) hours as needed for pain (or Fever >/= 101).      ALPRAZolam 1 MG tablet   Commonly known as: XANAX   Take 1 tablet (1 mg total) by mouth 3 (three) times daily as needed for anxiety.      atorvastatin 10 MG tablet   Commonly known as: LIPITOR   Take 10 mg by mouth daily.      bisacodyl 5 MG EC tablet   Commonly known as: DULCOLAX   Take 5 mg by mouth daily as needed. constipation      celecoxib 200 MG capsule   Commonly known as: CELEBREX   Take 1 capsule (200 mg total) by mouth daily.      cholecalciferol 1000 UNITS tablet   Commonly known as: VITAMIN D   Take 1,000 Units by mouth daily.      doxycycline 100 MG tablet   Commonly known as: VIBRA-TABS   Take 1 tablet (100 mg total) by mouth 2 (two) times daily.      DSS 100 MG Caps   Take 100 mg by mouth 2 (two) times daily.      enoxaparin 30 MG/0.3ML injection   Commonly known as: LOVENOX   Inject 0.3 mLs (30 mg total) into the skin every 12 (twelve) hours.      LORazepam 1 MG tablet   Commonly known as: ATIVAN   Take 0.5-1 tablets (0.5-1 mg total) by mouth at bedtime as needed. For  sleep      multivitamins ther. w/minerals Tabs   Take 1 tablet by mouth at bedtime.      REDNESS RELIEF OP   Apply 1 drop to  eye every other day.      senna 8.6 MG Tabs   Commonly known as: SENOKOT   Take 1 tablet by mouth at bedtime.      Tamsulosin HCl 0.4 MG Caps   Commonly known as: FLOMAX   Take 1 capsule (0.4 mg total) by mouth daily.         Diagnostic Studies: No results found.  Disposition: Skilled nursing facility  Discharge Orders    Future Orders Please Complete By Expires   Diet - low sodium heart healthy      Call MD / Call 911      Comments:   If you experience chest pain or shortness of breath, CALL 911 and be transported to the hospital emergency room.  If you develope a fever above 101 F, pus (white drainage) or increased drainage or redness at the wound, or calf pain, call your surgeon's office.   Constipation Prevention      Comments:   Drink plenty of fluids.  Prune juice may be helpful.  You may use a stool softener, such as Colace (over the counter) 100 mg twice a day.  Use MiraLax (over the counter) for constipation as needed.   Increase activity slowly as tolerated      Discharge instructions      Comments:   Total Knee Replacement Care After Refer to this sheet in the next few weeks. These discharge instructions provide you with general information on caring for yourself after you leave the hospital. Your caregiver may also give you specific instructions. Your treatment has been planned according to the most current medical practices available, but unavoidable complications sometimes occur. If you have any problems or questions after discharge, please call your caregiver. Regaining a near full range of motion of your knee within the first 3 to 6 weeks after surgery is critical. HOME CARE INSTRUCTIONS  You may resume a normal diet and activities as directed.  Perform exercises as directed.  Place yellow foam block, yellow side up under heel at all times except when in CPM or when walking.  DO NOT modify, tear, cut, or change in any way. You will receive physical therapy daily    Take showers instead of baths until informed otherwise.  Change bandages (dressings)daily Do not take over-the-counter or prescription medicines for pain, discomfort, or fever. Eat a well-balanced diet.  Avoid lifting or driving until you are instructed otherwise.  Make an appointment to see your caregiver for stitches (suture) or staple removal as directed.  If you have been sent home with a continuous passive motion machine (CPM machine), 0-90 degrees 6 hrs a day   2 hrs a shift SEEK MEDICAL CARE IF: You have swelling of your calf or leg.  You develop shortness of breath or chest pain.  You have redness, swelling, or increasing pain in the wound.  There is pus or any unusual drainage coming from the surgical site.  You notice a bad smell coming from the surgical site or dressing.  The surgical site breaks open after sutures or staples have been removed.  There is persistent bleeding from the suture or staple line.  You are getting worse or are not improving.  You have any other questions or concerns.  SEEK IMMEDIATE MEDICAL CARE IF:  You have a fever.  You develop a rash.  You have difficulty breathing.  You develop any reaction or side effects to medicines given.  Your knee motion is decreasing rather than improving.  MAKE SURE YOU:  Understand these instructions.  Will watch your condition.  Will get help right away if you are not doing well or get worse.   CPM      Comments:   Continuous passive motion machine (CPM):      Use the CPM from 0 to 90 for 6 hours per day.       You may break it up into 2 or 3 sessions per day.      Use CPM for 2 weeks or until you are told to stop.   TED hose      Comments:   Place yellow foam block, yellow side up under heel at all times except when in CPM or when walking.  DO NOT modify, tear, cut, or change in any way the yellow foam block.   Change dressing      Comments:   Change the dressing daily with sterile 4 x 4 inch gauze dressing  and apply TED hose.  You may clean the incision with alcohol prior to redressing.   Do not put a pillow under the knee. Place it under the heel.      Comments:   Place yellow foam block, yellow side up under heel at all times except when in CPM or when walking.  DO NOT modify, tear, cut, or change in any way the yellow foam block.      Follow-up Information    Follow up with Nilda Simmer, MD. On 10/03/2012. (appt time 2 pm)    Contact information:   [address] [place] [address] [phone]           Signed: Pascal Lux 09/22/2012, 7:51 AM

## 2012-09-22 NOTE — Progress Notes (Signed)
Vala Raffo, PTA [phone] 09/22/2012  

## 2012-09-22 NOTE — Progress Notes (Signed)
Physical Therapy Treatment Patient Details Name: Mark Berger MRN: [id number] DOB: 1924/10/04 Today's Date: 09/22/2012 Time: 4098-1191 PT Time Calculation (min): 26 min  PT Assessment / Plan / Recommendation Comments on Treatment Session  Pt. presents to be moving well with (A) when OOB. Pt still needing some redirection during today's session.  Pt cont's to require cues for sequencing during ambulation as well as safest hand placement with sit<>stand transfers.      Follow Up Recommendations  SNF     Does the patient have the potential to tolerate intense rehabilitation     Barriers to Discharge        Equipment Recommendations  None recommended by PT    Recommendations for Other Services    Frequency 7X/week   Plan Discharge plan remains appropriate;Frequency remains appropriate    Precautions / Restrictions Precautions Precautions: Knee;Fall Precaution Comments: Reviewed precautions Required Braces or Orthoses: Knee Immobilizer - Right Knee Immobilizer - Right: On except when in CPM;Discontinue post op day 2 Restrictions Weight Bearing Restrictions: Yes RLE Weight Bearing: Weight bearing as tolerated   Pertinent Vitals/Pain Patient rates soreness in his Rt knee, but did not give numerical rating.    Mobility  Bed Mobility Bed Mobility: Not assessed Transfers Transfers: Sit to Stand;Stand to Sit Sit to Stand: With upper extremity assist;With armrests;4: Min assist;From chair/3-in-1 Stand to Sit: 4: Min assist;With upper extremity assist;With armrests;To chair/3-in-1 Details for Transfer Assistance: Pt. given VC throughout session for proper handplacement when stepping to or sitting from RW and to extend his Rt knee. Ambulation/Gait Ambulation/Gait Assistance: 4: Min assist Ambulation Distance (Feet): 100 Feet Assistive device: Rolling walker Ambulation/Gait Assistance Details: Pt. ambulated into the hallway during today's session with min (A) for safety and VC  for sequencing of step pattern. Pt. still with some knee flexion with ambulation, but states that he knows he needs to push it back. Pt. still distracted with ambulation in hallway, but needing less redirection during today's session. Gait Pattern: Step-to pattern;Step-through pattern;Decreased stride length;Right flexed knee in stance Gait velocity: slow Stairs: No Wheelchair Mobility Wheelchair Mobility: No    Exercises Total Joint Exercises Ankle Circles/Pumps: AROM;Both;10 reps;Seated Quad Sets: AROM;Right;10 reps;Seated Heel Slides: AROM;AAROM;10 reps;Seated Straight Leg Raises: AAROM;10 reps;Right;Seated Long Arc Quad: AAROM;Right;5 reps;Seated     PT Goals Acute Rehab PT Goals PT Goal Formulation: With patient Time For Goal Achievement: 10/03/12 Potential to Achieve Goals: Good Pt will go Supine/Side to Sit: with supervision;with HOB 0 degrees Pt will go Sit to Supine/Side: with supervision;with HOB 0 degrees Pt will go Sit to Stand: with supervision;with upper extremity assist PT Goal: Sit to Stand - Progress: Not met Pt will go Stand to Sit: with supervision;with upper extremity assist PT Goal: Stand to Sit - Progress: Not met Pt will Transfer Bed to Chair/Chair to Bed: with supervision Pt will Ambulate: 51 - 150 feet;with supervision;with rolling walker PT Goal: Ambulate - Progress: Progressing toward goal Pt will Perform Home Exercise Program: with min assist PT Goal: Perform Home Exercise Program - Progress: Progressing toward goal  Visit Information  Last PT Received On: 09/22/12 Assistance Needed: +2 (to follow with chair)    Subjective Data  Subjective: "I'm doing good today, I can hold it up!" Patient Stated Goal: To get leg better   Cognition  Overall Cognitive Status: Impaired Area of Impairment: Safety/judgement Arousal/Alertness: Awake/alert Orientation Level: Appears intact for tasks assessed Behavior During Session: [facility] for tasks  performed Safety/Judgement: Decreased safety judgement for tasks assessed Cognition -  Other Comments: Pt. presented to be more focused on tasks during today's session with less redirection needed than in previous sessions.    Balance  Balance Balance Assessed: No  End of Session PT - End of Session Equipment Utilized During Treatment: Gait belt Activity Tolerance: Patient tolerated treatment well Patient left: in chair;with call bell/phone within reach Nurse Communication: Mobility status    Mertie Clause, SPTA 09/22/2012, 11:47 AM    Verdell Face, PTA [phone] 09/22/2012

## 2012-09-22 NOTE — Discharge Planning (Signed)
Report called to Lavonna Rua at [facility]

## 2013-01-29 ENCOUNTER — Emergency Department (HOSPITAL_COMMUNITY)
Admission: EM | Admit: 2013-01-29 | Discharge: 2013-01-29 | Disposition: A | Discharge status: Skilled Nursing Facility | Payer: Medicare Other | Attending: Emergency Medicine | Admitting: Emergency Medicine

## 2013-01-29 ENCOUNTER — Encounter (HOSPITAL_COMMUNITY): Payer: Self-pay | Admitting: Emergency Medicine

## 2013-01-29 ENCOUNTER — Emergency Department (HOSPITAL_COMMUNITY): Payer: Medicare Other

## 2013-01-29 DIAGNOSIS — Z8701 Personal history of pneumonia (recurrent): Secondary | ICD-10-CM | POA: Insufficient documentation

## 2013-01-29 DIAGNOSIS — Z79899 Other long term (current) drug therapy: Secondary | ICD-10-CM | POA: Insufficient documentation

## 2013-01-29 DIAGNOSIS — F411 Generalized anxiety disorder: Secondary | ICD-10-CM | POA: Insufficient documentation

## 2013-01-29 DIAGNOSIS — Z8669 Personal history of other diseases of the nervous system and sense organs: Secondary | ICD-10-CM | POA: Insufficient documentation

## 2013-01-29 DIAGNOSIS — N390 Urinary tract infection, site not specified: Secondary | ICD-10-CM | POA: Insufficient documentation

## 2013-01-29 DIAGNOSIS — M1990 Unspecified osteoarthritis, unspecified site: Secondary | ICD-10-CM | POA: Insufficient documentation

## 2013-01-29 DIAGNOSIS — Z8679 Personal history of other diseases of the circulatory system: Secondary | ICD-10-CM | POA: Insufficient documentation

## 2013-01-29 DIAGNOSIS — Z87440 Personal history of urinary (tract) infections: Secondary | ICD-10-CM | POA: Insufficient documentation

## 2013-01-29 DIAGNOSIS — N400 Enlarged prostate without lower urinary tract symptoms: Secondary | ICD-10-CM | POA: Insufficient documentation

## 2013-01-29 DIAGNOSIS — Z87442 Personal history of urinary calculi: Secondary | ICD-10-CM | POA: Insufficient documentation

## 2013-01-29 LAB — COMPREHENSIVE METABOLIC PANEL
Alkaline Phosphatase: 59 U/L (ref 39–117)
Chloride: 98 mEq/L (ref 96–112)
Creatinine, Ser: 0.63 mg/dL (ref 0.50–1.35)
GFR calc Af Amer: >90 mL/min (ref >90)
GFR calc non Af Amer: 87 mL/min — ABNORMAL LOW (ref >90)
Sodium: 134 mEq/L — ABNORMAL LOW (ref 135–145)
Total Protein: 6.6 g/dL (ref 6.0–8.3)

## 2013-01-29 LAB — URINALYSIS, ROUTINE W REFLEX MICROSCOPIC
Bilirubin Urine: NEGATIVE
Ketones, ur: 40 mg/dL — AB
Nitrite: POSITIVE — AB
Protein, ur: NEGATIVE mg/dL
Urobilinogen, UA: 0.2 mg/dL (ref 0.0–1.0)
pH: 6.5 (ref 5.0–8.0)

## 2013-01-29 LAB — CBC WITH DIFFERENTIAL/PLATELET
Basophils Absolute: 0 10*3/uL (ref 0.0–0.1)
Basophils Relative: 0 % (ref 0–1)
HCT: 40.5 % (ref 39.0–52.0)
MCHC: 33.3 g/dL (ref 30.0–36.0)
Monocytes Absolute: 0.4 10*3/uL (ref 0.1–1.0)
Neutro Abs: 4.6 10*3/uL (ref 1.7–7.7)
Platelets: 211 10*3/uL (ref 150–400)
RDW: 13.9 % (ref 11.5–15.5)
WBC: 5.8 10*3/uL (ref 4.0–10.5)

## 2013-01-29 MED ORDER — CEPHALEXIN 500 MG PO CAPS: 500.0000 mg | ORAL_CAPSULE | Freq: Three times a day (TID) | ORAL | Status: DC

## 2013-01-29 MED ORDER — CEPHALEXIN 500 MG PO CAPS
500.0000 mg | ORAL_CAPSULE | Freq: Once | ORAL | Status: AC
  Administered 2013-01-29: 500 mg via ORAL
  Filled 2013-01-29: qty 1

## 2013-01-29 MED ORDER — ONDANSETRON 8 MG PO TBDP
8.0000 mg | ORAL_TABLET | Freq: Once | ORAL | Status: AC
Start: 2013-01-29 — End: 2013-01-29
  Administered 2013-01-29: 8 mg via ORAL
  Filled 2013-01-29: qty 1

## 2013-01-29 MED ORDER — ONDANSETRON 8 MG PO TBDP: 8.0000 mg | ORAL_TABLET | Freq: Three times a day (TID) | ORAL | Status: DC | PRN

## 2013-01-29 NOTE — ED Notes (Signed)
Patient transported to X-ray 

## 2013-01-29 NOTE — ED Notes (Signed)
Report given to Ambulance person at [place] at [facility]

## 2013-01-29 NOTE — ED Notes (Signed)
[facility] notified of need for patients transfer back to facility

## 2013-01-29 NOTE — ED Notes (Signed)
Report called to [facility]. 

## 2013-01-29 NOTE — ED Notes (Signed)
Pt complains of nausea and vomiting x 1 day. Pt denies any abdominal pain at this time.

## 2013-01-29 NOTE — ED Provider Notes (Signed)
History     CSN: [id number]  Arrival date & time 01/29/13  1016   First MD Initiated Contact with Patient 01/29/13 1018      Chief Complaint  Patient presents with  . Emesis    (Consider location/radiation/quality/duration/timing/severity/associated sxs/prior treatment) HPI  Pt states he felt bad Friday, two days ago and cannot tell me what that means or give me any specific symptoms. He states last night he had a nightmare twice (once early in the evening and the other early this morning) that he cannot tell me what it was about except that afterwards he couldn't sleep. He has only slept about 3 hrs last night. He denies any change in his medications. Patient states at 5 AM this morning had nausea and vomited once. He denies nausea now. He denies abdominal pain, diarrhea or flank pain. He states "I think I have a kidney stone". He indicates to me he has pain in his upper mid lumbar spine area that he has had chronically and states it is no different than usual. He denies cough, chest pain, or shortness of breath. Patient states he feels he needs to be admitted to the hospital overnight.  PCP Dr Neale Burly  Past Medical History  Diagnosis Date  . Arthritis   . Trigeminal neuralgia   . Kidney calculi   . Right knee DJD   . Shortness of breath     exertion  . Anxiety   . Pneumonia     history of x3  . Urinary tract infection   . BPH (benign prostatic hyperplasia)     Past Surgical History  Procedure Laterality Date  . Hernia repair    . Hemorrhoid surgery    . Eye surgery      bilateral  . Total knee arthroplasty  09/19/2012    Procedure: TOTAL KNEE ARTHROPLASTY;  Surgeon: Nilda Simmer, MD;  Location: [facility] OR;  Service: Orthopedics;  Laterality: Right;  right total knee arthroplasty    No family history on file.  History  Substance Use Topics  . Smoking status: Never Smoker   . Smokeless tobacco: Not on file  . Alcohol Use: No   Lives in rehab Uses a  walker   Review of Systems  All other systems reviewed and are negative.    Allergies  Review of patient's allergies indicates no known allergies.  Home Medications   Current Outpatient Rx  Name  Route  Sig  Dispense  Refill  . acetaminophen (TYLENOL) 325 MG tablet   Oral   Take 2 tablets (650 mg total) by mouth every 4 (four) hours as needed for pain (or Fever >/= 101).         Marland Kitchen ALPRAZolam (XANAX) 1 MG tablet   Oral   Take 1 mg by mouth 2 (two) times daily.         Marland Kitchen atorvastatin (LIPITOR) 10 MG tablet   Oral   Take 10 mg by mouth daily.         . celecoxib (CELEBREX) 200 MG capsule   Oral   Take 1 capsule (200 mg total) by mouth daily.   30 capsule   3   . cholecalciferol (VITAMIN D) 1000 UNITS tablet   Oral   Take 1,000 Units by mouth daily.         Marland Kitchen docusate sodium 100 MG CAPS   Oral   Take 100 mg by mouth 2 (two) times daily.   60 capsule   0   .  LORazepam (ATIVAN) 1 MG tablet   Oral   Take 0.5-1 tablets (0.5-1 mg total) by mouth at bedtime as needed. For sleep   30 tablet   0   . methocarbamol (ROBAXIN) 500 MG tablet   Oral   Take 500 mg by mouth every 6 (six) hours as needed.         . Multiple Vitamins-Minerals (MULTIVITAMINS THER. W/MINERALS) TABS   Oral   Take 1 tablet by mouth at bedtime.         . senna (SENOKOT) 8.6 MG TABS   Oral   Take 1 tablet by mouth 2 (two) times daily.          . Tamsulosin HCl (FLOMAX) 0.4 MG CAPS   Oral   Take 1 capsule (0.4 mg total) by mouth daily.   30 capsule   0   . traMADol (ULTRAM) 50 MG tablet   Oral   Take 50 mg by mouth every 6 (six) hours as needed for pain.           BP 160/79  Pulse 92  Temp(Src) 97.7 F (36.5 C) (Oral)  Resp 18  SpO2 96%  Vital signs normal    Physical Exam  Nursing note and vitals reviewed. Constitutional: He is oriented to person, place, and time. He appears well-developed and well-nourished.  Non-toxic appearance. He does not appear ill. No  distress.  Talkative, some pressured speech  HENT:  Head: Normocephalic and atraumatic.  Right Ear: External ear normal.  Left Ear: External ear normal.  Nose: Nose normal. No mucosal edema or rhinorrhea.  Mouth/Throat: Oropharynx is clear and moist and mucous membranes are normal. No dental abscesses or edematous.  Eyes: Conjunctivae and EOM are normal. Pupils are equal, round, and reactive to light.  Neck: Normal range of motion and full passive range of motion without pain. Neck supple.  Cardiovascular: Normal rate, regular rhythm and normal heart sounds.  Exam reveals no gallop and no friction rub.   No murmur heard. Pulmonary/Chest: Effort normal and breath sounds normal. No respiratory distress. He has no wheezes. He has no rhonchi. He has no rales. He exhibits no tenderness and no crepitus.  Abdominal: Soft. Normal appearance and bowel sounds are normal. He exhibits no distension. There is no tenderness. There is no rebound and no guarding.  Musculoskeletal: Normal range of motion. He exhibits no edema and no tenderness.  Moves all extremities well.   Neurological: He is alert and oriented to person, place, and time. He has normal strength. No cranial nerve deficit.  Skin: Skin is warm, dry and intact. No rash noted. No erythema. No pallor.  Psychiatric: He has a normal mood and affect. His speech is normal and behavior is normal. His mood appears not anxious.    ED Course  Procedures (including critical care time)  Medications  cephALEXin (KEFLEX) capsule 500 mg (not administered)  ondansetron (ZOFRAN-ODT) disintegrating tablet 8 mg (8 mg Oral Given 01/29/13 1249)   Pt had no vomiting during his ED visit. He remains awake and alert. He seems reluctant to go back to his facility.   Patient informed he has a UTI. He states he finished a 12 day course of Cipro 6 weeks ago. He relates after that he was placed on urinary analgesic. Review of his prior laboratory test shows in October  2013 he had a urine culture growing 75,000 staph aureus, and in April 2013 he had a positive Urine Culture with more than 100,000 Citrobacter  Koseri  Results for orders placed during the hospital encounter of 01/29/13  CBC WITH DIFFERENTIAL      Result Value Range   WBC 5.8  4.0 - 10.5 K/uL   RBC 4.44  4.22 - 5.81 MIL/uL   Hemoglobin 13.5  13.0 - 17.0 g/dL   HCT 40.9  81.1 - 91.4 %   MCV 91.2  78.0 - 100.0 fL   MCH 30.4  26.0 - 34.0 pg   MCHC 33.3  30.0 - 36.0 g/dL   RDW 78.2  95.6 - 21.3 %   Platelets 211  150 - 400 K/uL   Neutrophils Relative 78 (*) 43 - 77 %   Neutro Abs 4.6  1.7 - 7.7 K/uL   Lymphocytes Relative 15  12 - 46 %   Lymphs Abs 0.9  0.7 - 4.0 K/uL   Monocytes Relative 7  3 - 12 %   Monocytes Absolute 0.4  0.1 - 1.0 K/uL   Eosinophils Relative 0  0 - 5 %   Eosinophils Absolute 0.0  0.0 - 0.7 K/uL   Basophils Relative 0  0 - 1 %   Basophils Absolute 0.0  0.0 - 0.1 K/uL  COMPREHENSIVE METABOLIC PANEL      Result Value Range   Sodium 134 (*) 135 - 145 mEq/L   Potassium 3.8  3.5 - 5.1 mEq/L   Chloride 98  96 - 112 mEq/L   CO2 25  19 - 32 mEq/L   Glucose, Bld 142 (*) 70 - 99 mg/dL   BUN 10  6 - 23 mg/dL   Creatinine, Ser 0.86  0.50 - 1.35 mg/dL   Calcium 9.3  8.4 - 57.8 mg/dL   Total Protein 6.6  6.0 - 8.3 g/dL   Albumin 3.5  3.5 - 5.2 g/dL   AST 13  0 - 37 U/L   ALT 12  0 - 53 U/L   Alkaline Phosphatase 59  39 - 117 U/L   Total Bilirubin 1.0  0.3 - 1.2 mg/dL   GFR calc non Af Amer 87 (*) >90 mL/min   GFR calc Af Amer >90  >90 mL/min  URINALYSIS, ROUTINE W REFLEX MICROSCOPIC      Result Value Range   Color, Urine YELLOW  YELLOW   APPearance CLEAR  CLEAR   Specific Gravity, Urine 1.020  1.005 - 1.030   pH 6.5  5.0 - 8.0   Glucose, UA 100 (*) NEGATIVE mg/dL   Hgb urine dipstick NEGATIVE  NEGATIVE   Bilirubin Urine NEGATIVE  NEGATIVE   Ketones, ur 40 (*) NEGATIVE mg/dL   Protein, ur NEGATIVE  NEGATIVE mg/dL   Urobilinogen, UA 0.2  0.0 - 1.0 mg/dL   Nitrite  POSITIVE (*) NEGATIVE   Leukocytes, UA SMALL (*) NEGATIVE  URINE MICROSCOPIC-ADD ON      Result Value Range   WBC, UA 3-6  <3 WBC/hpf   Bacteria, UA MANY (*) RARE   Laboratory interpretation all normal except UTI present   Dg Abd Acute W/chest  01/29/2013  *RADIOLOGY REPORT*  Clinical Data: Nausea, vomiting  ACUTE ABDOMEN SERIES (ABDOMEN 2 VIEW & CHEST 1 VIEW)  Comparison: 08/15/2012  Findings: Normal heart size and vascularity.  Low lung volumes with mild chronic basilar interstitial changes and scarring.  No CHF or pneumonia.  No effusion or pneumothorax.  Trachea is midline. Negative for free air.  Scattered air and stool throughout the bowel.  No significant obstruction pattern.  Bones appear osteopenic.  Degenerative changes  of the spine.  Chronic-appearing compression fracture T12.  IMPRESSION: Negative for obstruction or free air.  Stable chronic chest findings.   Original Report Authenticated By: Judie Petit. Shick, M.D.      1. UTI (lower urinary tract infection)     New Prescriptions   CEPHALEXIN (KEFLEX) 500 MG CAPSULE    Take 1 capsule (500 mg total) by mouth 3 (three) times daily.   ONDANSETRON (ZOFRAN ODT) 8 MG DISINTEGRATING TABLET    Take 1 tablet (8 mg total) by mouth every 8 (eight) hours as needed for nausea.    Plan discharge  Devoria Albe, MD, FACEP   MDM          Ward Givens, MD 01/29/13 [phone]

## 2013-02-01 LAB — URINE CULTURE: Colony Count: >=100000

## 2013-02-05 ENCOUNTER — Telehealth (HOSPITAL_COMMUNITY): Payer: Self-pay | Admitting: Emergency Medicine

## 2013-02-05 NOTE — ED Notes (Signed)
Spoke with Kipp Brood, RN, who is taking care of patient at [facility] and informed her of +Urine. Faxed results over to RN.

## 2013-02-05 NOTE — ED Notes (Signed)
Chart returned from EDP office. Per Trixie Dredge PA-C, consulted Arline Asp, pharmacist, who recommends treatment for both enterococcus and pseudomonas with Cipro. Give Cipro 500 mg PO BID x 10 days, must follow-up with PCP Dr. Gillermina Phy.

## 2013-06-06 ENCOUNTER — Other Ambulatory Visit (HOSPITAL_COMMUNITY): Payer: Self-pay | Admitting: Internal Medicine

## 2013-06-06 ENCOUNTER — Ambulatory Visit (HOSPITAL_COMMUNITY)
Admission: RE | Admit: 2013-06-06 | Discharge: 2013-06-06 | Disposition: A | Discharge status: Home / Self Care | Payer: Medicare Other | Source: Ambulatory Visit | Attending: Internal Medicine | Admitting: Internal Medicine

## 2013-06-06 ENCOUNTER — Encounter (HOSPITAL_COMMUNITY): Payer: Self-pay

## 2013-06-06 DIAGNOSIS — M810 Age-related osteoporosis without current pathological fracture: Secondary | ICD-10-CM | POA: Insufficient documentation

## 2013-06-06 MED ORDER — SODIUM CHLORIDE 0.9 % IV SOLN
Freq: Once | INTRAVENOUS | Status: AC
  Administered 2013-06-06: 11:00:00 via INTRAVENOUS

## 2013-06-06 MED ORDER — ZOLEDRONIC ACID 5 MG/100ML IV SOLN
5.0000 mg | Freq: Once | INTRAVENOUS | Status: AC
  Administered 2013-06-06: 5 mg via INTRAVENOUS
  Filled 2013-06-06: qty 100

## 2013-06-07 ENCOUNTER — Ambulatory Visit (HOSPITAL_COMMUNITY): Payer: Medicare Other

## 2014-08-06 ENCOUNTER — Ambulatory Visit (INDEPENDENT_AMBULATORY_CARE_PROVIDER_SITE_OTHER): Payer: Medicare Other | Admitting: Podiatry

## 2014-08-06 ENCOUNTER — Encounter: Payer: Self-pay | Admitting: Podiatry

## 2014-08-06 DIAGNOSIS — B351 Tinea unguium: Secondary | ICD-10-CM

## 2014-08-06 DIAGNOSIS — M79673 Pain in unspecified foot: Secondary | ICD-10-CM

## 2014-08-06 NOTE — Progress Notes (Signed)
Subjective:     Patient ID: Mark Berger, male   DOB: 03/20/1924, 80 y.o.   MRN: [id number]  HPI patient presents with thick nailbeds 1-5 both feet that are painful when pressed   Review of Systems     Objective:   Physical Exam Neurovascular status intact with thick yellow brittle nailbeds 1-5 both feet    Assessment:     Mycotic nail infection with pain 1-5 both feet    Plan:     Debride painful nailbeds 1-5 both feet with no iatrogenic bleeding noted      

## 2014-08-06 NOTE — Progress Notes (Signed)
   Subjective:    Patient ID: Mark Berger, male    DOB: 10/02/1924, 80 y.o.   MRN: [id number]  HPI Pt presents for nail debridement   Review of Systems     Objective:   Physical Exam        Assessment & Plan:

## 2014-09-03 ENCOUNTER — Encounter: Payer: Self-pay | Admitting: Internal Medicine

## 2014-09-06 ENCOUNTER — Encounter (HOSPITAL_COMMUNITY): Payer: Self-pay

## 2014-09-06 ENCOUNTER — Other Ambulatory Visit (HOSPITAL_COMMUNITY): Payer: Self-pay | Admitting: Internal Medicine

## 2014-09-06 ENCOUNTER — Ambulatory Visit (HOSPITAL_COMMUNITY)
Admission: RE | Admit: 2014-09-06 | Discharge: 2014-09-06 | Disposition: A | Discharge status: Home / Self Care | Payer: Medicare Other | Source: Ambulatory Visit | Attending: Internal Medicine | Admitting: Internal Medicine

## 2014-09-06 DIAGNOSIS — M810 Age-related osteoporosis without current pathological fracture: Secondary | ICD-10-CM | POA: Insufficient documentation

## 2014-09-06 MED ORDER — SODIUM CHLORIDE 0.9 % IV SOLN
250.0000 mL | Freq: Once | INTRAVENOUS | Status: AC
  Administered 2014-09-06: 250 mL via INTRAVENOUS

## 2014-09-06 MED ORDER — ZOLEDRONIC ACID 5 MG/100ML IV SOLN
5.0000 mg | Freq: Once | INTRAVENOUS | Status: AC
  Administered 2014-09-06: 5 mg via INTRAVENOUS
  Filled 2014-09-06: qty 100

## 2014-09-06 NOTE — Discharge Instructions (Signed)

## 2014-09-24 ENCOUNTER — Ambulatory Visit (HOSPITAL_COMMUNITY): Payer: Medicare Other

## 2014-11-12 ENCOUNTER — Ambulatory Visit (INDEPENDENT_AMBULATORY_CARE_PROVIDER_SITE_OTHER): Payer: Medicare Other | Admitting: Podiatry

## 2014-11-12 DIAGNOSIS — B351 Tinea unguium: Secondary | ICD-10-CM

## 2014-11-12 DIAGNOSIS — M79673 Pain in unspecified foot: Secondary | ICD-10-CM

## 2014-11-12 NOTE — Progress Notes (Signed)
Subjective:     Patient ID: Mark FlatteryRobert C Berger, male   DOB: 09/17/1924, 81 y.o.   MRN: [id number]  HPI patient presents with thick nailbeds 1-5 both feet that are painful when pressed   Review of Systems     Objective:   Physical Exam Neurovascular status intact with thick yellow brittle nailbeds 1-5 both feet    Assessment:     Mycotic nail infection with pain 1-5 both feet    Plan:     Debride painful nailbeds 1-5 both feet with no iatrogenic bleeding noted

## 2015-02-11 ENCOUNTER — Ambulatory Visit (INDEPENDENT_AMBULATORY_CARE_PROVIDER_SITE_OTHER): Payer: Medicare Other | Admitting: Podiatry

## 2015-02-11 DIAGNOSIS — B351 Tinea unguium: Secondary | ICD-10-CM

## 2015-02-11 DIAGNOSIS — M79673 Pain in unspecified foot: Secondary | ICD-10-CM

## 2015-02-11 NOTE — Progress Notes (Signed)
Subjective:     Patient ID: Mark FlatteryRobert C Berger, male   DOB: 09/11/1924, 81 y.o.   MRN: [id number]  HPI patient presents with thick nailbeds 1-5 both feet that are painful when pressed   Review of Systems     Objective:   Physical Exam Neurovascular status intact with thick yellow brittle nailbeds 1-5 both feet    Assessment:     Mycotic nail infection with pain 1-5 both feet    Plan:     Debride painful nailbeds 1-5 both feet with no iatrogenic bleeding noted

## 2015-06-03 ENCOUNTER — Ambulatory Visit: Payer: Medicare Other

## 2015-06-04 ENCOUNTER — Ambulatory Visit: Payer: Medicare Other | Admitting: Podiatry

## 2015-06-25 ENCOUNTER — Ambulatory Visit (INDEPENDENT_AMBULATORY_CARE_PROVIDER_SITE_OTHER): Payer: Medicare Other | Admitting: Podiatry

## 2015-06-25 ENCOUNTER — Encounter: Payer: Self-pay | Admitting: Podiatry

## 2015-06-25 DIAGNOSIS — B351 Tinea unguium: Secondary | ICD-10-CM

## 2015-06-25 DIAGNOSIS — M79676 Pain in unspecified toe(s): Secondary | ICD-10-CM | POA: Diagnosis not present

## 2015-06-25 NOTE — Patient Instructions (Signed)
Removed Band-Aid on right great toenail area in 24 hours. Apply topical antibiotic ointment daily and cover with a Band-Aid until a scab forms

## 2015-06-25 NOTE — Progress Notes (Signed)
Patient ID: Mark Berger, male   DOB: [birth year], 81 y.o.   MRN: [id number]  Subjective: This patient presents for scheduled visit complaining of painful toenails and requests toenail debridement  Objective: Patient transfers from wheelchair to treatment chair He sits with his knees flexed, bilaterally The toenails are brittle, elongated, discolored, hypertrophic and tender to direct palpation 6-10  Assessment: Symptomatic onychomycoses 6-10  Plan: Debridement toenails 10 mechanically and electrically. Slight pinpoint bleeding distal right hallux nail treated with topical antibiotic and Band-Aid. Patient advised to continue applying topical antibiotic ointment Band-Aid to this area until a scab forms  Reappoint 3 months

## 2015-09-25 ENCOUNTER — Ambulatory Visit: Payer: Medicare Other | Admitting: Podiatry

## 2015-10-01 ENCOUNTER — Encounter: Payer: Self-pay | Admitting: Podiatry

## 2015-10-01 ENCOUNTER — Ambulatory Visit (INDEPENDENT_AMBULATORY_CARE_PROVIDER_SITE_OTHER): Payer: Medicare Other | Admitting: Podiatry

## 2015-10-01 DIAGNOSIS — B351 Tinea unguium: Secondary | ICD-10-CM

## 2015-10-01 DIAGNOSIS — M79676 Pain in unspecified toe(s): Secondary | ICD-10-CM | POA: Diagnosis not present

## 2015-10-01 NOTE — Patient Instructions (Signed)
Removed Band-Aid on a great toe in 1-2 days. Apply additional topical antibiotic ointment to the end of the right great toe daily and cover with a Band-Aid until a scab forms

## 2015-10-01 NOTE — Progress Notes (Signed)
Patient ID: Mark FlatteryRobert C Berger, male   DOB: 08/18/1924, 81 y.o.   MRN: [id number]  Subjective: This patient presents for scheduled visit again requesting debridement of painful toenails which are uncomfortable walking wearing shoes  Objective: Patient transfers from wheelchair to treatment chair He says with his knees flexed bilaterally The toenails are hypertrophic, brittle, discolored, incurvated and tender to direct palpation 6-10  Plan: Symptomatic onychomycoses 6-10  Plan: Debrided toenails 10 and mechanically and electrically. Slight bleeding distal right hallux post debridement treated with topical antibiotic ointment and Band-Aid. Patient advised to continue applying topical ointment and Band-Aid to this area until a scab forms  Reappoint 3 months

## 2015-12-25 ENCOUNTER — Ambulatory Visit (HOSPITAL_COMMUNITY): Admission: RE | Admit: 2015-12-25 | Payer: Medicare Other | Source: Ambulatory Visit

## 2015-12-25 ENCOUNTER — Other Ambulatory Visit (HOSPITAL_COMMUNITY): Payer: Self-pay | Admitting: *Deleted

## 2015-12-26 ENCOUNTER — Ambulatory Visit (HOSPITAL_COMMUNITY)
Admission: RE | Admit: 2015-12-26 | Discharge: 2015-12-26 | Disposition: A | Discharge status: Home / Self Care | Payer: Medicare Other | Source: Ambulatory Visit | Attending: Internal Medicine | Admitting: Internal Medicine

## 2015-12-26 DIAGNOSIS — M810 Age-related osteoporosis without current pathological fracture: Secondary | ICD-10-CM | POA: Insufficient documentation

## 2015-12-26 MED ORDER — ZOLEDRONIC ACID 5 MG/100ML IV SOLN
5.0000 mg | Freq: Once | INTRAVENOUS | Status: AC
  Administered 2015-12-26: 5 mg via INTRAVENOUS

## 2015-12-26 MED ORDER — ZOLEDRONIC ACID 5 MG/100ML IV SOLN
INTRAVENOUS | Status: AC
  Administered 2015-12-26: 5 mg via INTRAVENOUS
  Filled 2015-12-26: qty 100

## 2016-01-07 ENCOUNTER — Ambulatory Visit (INDEPENDENT_AMBULATORY_CARE_PROVIDER_SITE_OTHER): Payer: Medicare Other | Admitting: Podiatry

## 2016-01-07 ENCOUNTER — Encounter: Payer: Self-pay | Admitting: Podiatry

## 2016-01-07 DIAGNOSIS — M79676 Pain in unspecified toe(s): Secondary | ICD-10-CM

## 2016-01-07 DIAGNOSIS — B351 Tinea unguium: Secondary | ICD-10-CM | POA: Diagnosis not present

## 2016-01-07 NOTE — Progress Notes (Signed)
Patient ID: Mark FlatteryRobert C Berger, male   DOB: 02/21/1924, 82 y.o.   MRN: [id number]  Subjective: This patient presents for scheduled visit again requesting debridement of painful toenails which are uncomfortable walking wearing shoes  Objective: Orientated 3 Patient transfers from wheelchair to treatment chair He says with his knees flexed bilaterally The toenails are hypertrophic, brittle, discolored, incurvated and tender to direct palpation 6-10  Plan: Symptomatic onychomycoses 6-10  Plan: Debrided toenails 10 and mechanically and electrically. Without any bleeding Reappoint 3 months

## 2016-04-14 ENCOUNTER — Encounter: Payer: Self-pay | Admitting: Podiatry

## 2016-04-14 ENCOUNTER — Ambulatory Visit (INDEPENDENT_AMBULATORY_CARE_PROVIDER_SITE_OTHER): Payer: Medicare Other | Admitting: Podiatry

## 2016-04-14 DIAGNOSIS — M79676 Pain in unspecified toe(s): Secondary | ICD-10-CM

## 2016-04-14 DIAGNOSIS — B351 Tinea unguium: Secondary | ICD-10-CM

## 2016-04-14 NOTE — Progress Notes (Signed)
Patient ID: Mark FlatteryRobert C Berger, male   DOB: 04/20/1924, 82 y.o.   MRN: [id number]    Expand All Collapse All   Patient ID: Mark FlatteryRobert C Berger, male DOB: 04/22/1924, 82 y.o. MRN: [id number]  Subjective: This patient presents for scheduled visit again requesting debridement of painful toenails which are uncomfortable walking wearing shoes  Objective: Orientated 3 Patient transfers from wheelchair to treatment chair He sits  with his knees flexed bilaterally The toenails are hypertrophic, brittle, discolored, incurvated and tender to direct palpation 6-10  Plan: Symptomatic onychomycoses 6-10  Plan: Debrided toenails 10 and mechanically and electrically. Without any bleeding  Reappoint 3 months

## 2016-04-14 NOTE — Patient Instructions (Signed)
Return every 3 months for debridement of mycotic toenails Mark Berger, DPM [facility]

## 2016-06-18 ENCOUNTER — Encounter (HOSPITAL_COMMUNITY): Payer: Self-pay | Admitting: Emergency Medicine

## 2016-06-18 ENCOUNTER — Emergency Department (HOSPITAL_COMMUNITY)
Admission: EM | Admit: 2016-06-18 | Discharge: 2016-06-18 | Disposition: A | Discharge status: Home / Self Care | Payer: Medicare Other | Attending: Emergency Medicine | Admitting: Emergency Medicine

## 2016-06-18 DIAGNOSIS — R300 Dysuria: Secondary | ICD-10-CM | POA: Diagnosis present

## 2016-06-18 DIAGNOSIS — R339 Retention of urine, unspecified: Secondary | ICD-10-CM | POA: Diagnosis not present

## 2016-06-18 DIAGNOSIS — Z79899 Other long term (current) drug therapy: Secondary | ICD-10-CM | POA: Diagnosis not present

## 2016-06-18 DIAGNOSIS — Z79891 Long term (current) use of opiate analgesic: Secondary | ICD-10-CM | POA: Diagnosis not present

## 2016-06-18 HISTORY — DX: Hypothyroidism, unspecified: E03.9

## 2016-06-18 LAB — URINALYSIS, ROUTINE W REFLEX MICROSCOPIC
Bilirubin Urine: NEGATIVE
Glucose, UA: NEGATIVE mg/dL
HGB URINE DIPSTICK: NEGATIVE
Ketones, ur: NEGATIVE mg/dL
Leukocytes, UA: NEGATIVE
NITRITE: NEGATIVE
PROTEIN: NEGATIVE mg/dL
Specific Gravity, Urine: 1.01 (ref 1.005–1.030)
pH: 7.5 (ref 5.0–8.0)

## 2016-06-18 NOTE — ED Triage Notes (Signed)
Per EMS. Pt from [facility]. Facility sent out patient due to concern for UTI and elevated BP. BP 138/74 in triage and was WNL with EMS. Pt reports he has had dysuria for the past few days. No pain at present. Only complain is that he is hungry/thirsty from missing breakfast.

## 2016-06-18 NOTE — ED Notes (Signed)
Pt attempting to get urine sample. 

## 2016-06-18 NOTE — ED Provider Notes (Signed)
[facility]-EMERGENCY DEPT Provider Note   CSN: [id number] Arrival date & time: 06/18/16  1037     History   Chief Complaint Chief Complaint  Patient presents with  . Dysuria    HPI Jeanella FlatteryRobert C Clemons is a 82 y.o. male.  The history is provided by the patient and medical records.  Is reports increasing urinary frequency over the past several days.  He reports a small amount of urine comes out a time.  He is not sure if he is completely emptying his bladder.  He denies dysuria.  No fevers or chills.  Denies nausea vomiting.  He reports she's never had a catheter before.  Denies abdominal pain or back pain.  No other complaints at this time.  Past Medical History:  Diagnosis Date  . Anxiety   . Arthritis   . BPH (benign prostatic hyperplasia)   . Hypothyroid   . Kidney calculi   . Pneumonia    history of x3  . Right knee DJD   . Shortness of breath    exertion  . Trigeminal neuralgia   . Urinary tract infection     Patient Active Problem List   Diagnosis Date Noted  . Boil on right hamstring 09/19/2012  . Shortness of breath   . Anxiety   . BPH (benign prostatic hyperplasia)   . MRSA (methicillin resistant Staphylococcus aureus) carrier 08/11/2012  . Arthritis   . Kidney calculi   . Right knee DJD   . Syncope 12/19/2011    Class: Acute  . Trigeminal neuralgia 12/19/2011  . Osteoarthritis of knee 12/19/2011  . Urinary tract infection 12/19/2011    Class: Acute  . Benign prostate hyperplasia 12/19/2011  . Chronic low back pain 12/19/2011    Class: Chronic    Past Surgical History:  Procedure Laterality Date  . EYE SURGERY     bilateral  . HEMORRHOID SURGERY    . HERNIA REPAIR    . TOTAL KNEE ARTHROPLASTY  09/19/2012   Procedure: TOTAL KNEE ARTHROPLASTY;  Surgeon: Nilda Simmerobert A Wainer, MD;  Location: [facility] OR;  Service: Orthopedics;  Laterality: Right;  right total knee arthroplasty       Home Medications    Prior to Admission medications   Medication Sig Start  Date End Date Taking? Authorizing Provider  acetaminophen (TYLENOL) 325 MG tablet Take 2 tablets (650 mg total) by mouth every 4 (four) hours as needed for pain (or Fever >/= 101). 09/22/12   Kirstin Shepperson, PA-C  ALPRAZolam (XANAX) 1 MG tablet Take 1 mg by mouth 2 (two) times daily. 09/22/12   Kirstin Shepperson, PA-C  amLODipine (NORVASC) 5 MG tablet  12/19/15   Historical Provider, MD  atorvastatin (LIPITOR) 10 MG tablet Take 10 mg by mouth daily.    Historical Provider, MD  cholecalciferol (VITAMIN D) 1000 UNITS tablet Take 1,000 Units by mouth daily.    Historical Provider, MD  Dextromethorphan-Guaifenesin (DIABETIC TUSSIN DM PO) Take by mouth. As per directed per instruc    Historical Provider, MD  docusate sodium 100 MG CAPS Take 100 mg by mouth 2 (two) times daily. 09/22/12   Kirstin Shepperson, PA-C  fluticasone (FLONASE) 50 MCG/ACT nasal spray  01/01/16   Historical Provider, MD  LORazepam (ATIVAN) 0.5 MG tablet  01/07/16   Historical Provider, MD  methocarbamol (ROBAXIN) 500 MG tablet Take 500 mg by mouth every 6 (six) hours as needed.    Historical Provider, MD  Multiple Vitamin (DAILY VITE) TABS Take by mouth daily.  Historical Provider, MD  Multiple Vitamins-Minerals (MULTIVITAMINS THER. W/MINERALS) TABS Take 1 tablet by mouth at bedtime.    Historical Provider, MD  naproxen sodium (ANAPROX) 220 MG tablet Take 220 mg by mouth 2 (two) times daily with a meal.    Historical Provider, MD  ondansetron (ZOFRAN ODT) 8 MG disintegrating tablet Take 1 tablet (8 mg total) by mouth every 8 (eight) hours as needed for nausea. 01/29/13   Devoria AlbeIva Knapp, MD  senna (SENOKOT) 8.6 MG TABS Take 1 tablet by mouth 2 (two) times daily.     Historical Provider, MD  Tamsulosin HCl (FLOMAX) 0.4 MG CAPS Take 1 capsule (0.4 mg total) by mouth daily. 02/16/12   Marisa Severinlga Otter, MD  traMADol (ULTRAM) 50 MG tablet Take 50 mg by mouth every 6 (six) hours as needed for pain.    Historical Provider, MD    Family  History History reviewed. No pertinent family history.  Social History Social History  Substance Use Topics  . Smoking status: Never Smoker  . Smokeless tobacco: Never Used  . Alcohol use No     Allergies   Altace [ramipril]   Review of Systems Review of Systems  All other systems reviewed and are negative.    Physical Exam Updated Vital Signs BP 138/74   Pulse 95   Temp 98 F (36.7 C) (Oral)   Resp 16   SpO2 95%   Physical Exam  Constitutional: He is oriented to person, place, and time. He appears well-developed and well-nourished.  HENT:  Head: Normocephalic.  Eyes: EOM are normal.  Neck: Normal range of motion.  Pulmonary/Chest: Effort normal.  Abdominal: He exhibits no distension.  Genitourinary:  Genitourinary Comments: Uncircumcised penis.  Scrotum and testicles normal.  Musculoskeletal: Normal range of motion.  Neurological: He is alert and oriented to person, place, and time.  Psychiatric: He has a normal mood and affect.  Nursing note and vitals reviewed.    ED Treatments / Results  Labs (all labs ordered are listed, but only abnormal results are displayed) Labs Reviewed  URINALYSIS, ROUTINE W REFLEX MICROSCOPIC (NOT AT [facility])    EKG  EKG Interpretation None       Radiology No results found.  Procedures Procedures (including critical care time)  Medications Ordered in ED Medications - No data to display   Initial Impression / Assessment and Plan / ED Course  I have reviewed the triage vital signs and the nursing notes.  Pertinent labs & imaging results that were available during my care of the patient were reviewed by me and considered in my medical decision making (see chart for details).  Clinical Course    Urine without signs of infection.  Postvoid residual was nearly 400 cc.  Fully catheter placed and patient had nearly 500 cc of urine returned.  Patient will be discharged home with catheter in place.  He understands return  to the ER for new or worsening symptoms  Final Clinical Impressions(s) / ED Diagnoses   Final diagnoses:  Urinary retention    New Prescriptions New Prescriptions   No medications on file     Azalia BilisKevin Okema Rollinson, MD 06/18/16 1407

## 2016-06-18 NOTE — ED Notes (Signed)
Bed: OZ30WA11 Expected date:  Expected time:  Means of arrival:  Comments: EMS/82YO Frequent urination VSS

## 2016-06-20 LAB — URINE CULTURE: Culture: >=100000 — AB

## 2016-06-21 ENCOUNTER — Telehealth (HOSPITAL_BASED_OUTPATIENT_CLINIC_OR_DEPARTMENT_OTHER): Payer: Self-pay

## 2016-06-21 NOTE — Progress Notes (Signed)
ED Antimicrobial Stewardship Positive Culture Follow Up   [name] is an 82 y.o. male who [facility] with a chief complaint of  Chief Complaint  Patient presents with  . Dysuria    Recent Results (from the past 720 hour(s))  Urine culture     Status: Abnormal   Collection Time: 06/18/16 11:43 AM  Result Value Ref Range Status   Specimen Description URINE, RANDOM  Final   Special Requests NONE  Final   Culture >=100,000 COLONIES/mL ENTEROCOCCUS FAECALIS (A)  Final   Report Status 06/20/2016 FINAL  Final   Organism ID, Bacteria ENTEROCOCCUS FAECALIS (A)  Final      Susceptibility   Enterococcus faecalis - MIC*    AMPICILLIN <=2 SENSITIVE Sensitive     LEVOFLOXACIN 1 SENSITIVE Sensitive     NITROFURANTOIN <=16 SENSITIVE Sensitive     VANCOMYCIN 1 SENSITIVE Sensitive     * >=100,000 COLONIES/mL ENTEROCOCCUS FAECALIS    []  Treated with N/A, organism resistant to prescribed antimicrobial [x]  Patient discharged originally without antimicrobial agent and treatment is now indicated  New antibiotic prescription: amoxicillin 500mg  PO BID x 7 days  ED Provider: Danelle BerryLeisa Tapia, PA   Mark Berger, Mark Berger 06/21/2016, 11:03 AM Clinical Pharmacist Phone# [phone]

## 2016-06-21 NOTE — Telephone Encounter (Signed)
Post ED Visit - Positive Culture Follow-up: Successful Patient Follow-Up  Culture assessed and recommendations reviewed by: []  Enzo BiNathan Batchelder, Pharm.D. []  Celedonio MiyamotoJeremy Frens, [address]Ph[address] BCPS []  Garvin FilaMike Maccia, Pharm.D. []  Georgina PillionElizabeth Martin, Pharm.D., BCPS []  [place] CreekMinh Pham, [address]Pharm.D., BCPS, AAHIVP []  Estella HuskMichelle Turner, Pharm.D., BCPS, AAHIVP []  Tennis Mustassie Stewart, Pharm.D. []  Sherle Poeob Vincent, [address]Ph[address] Rachel Rumbarger Pharm D  Positive urine culture  []  Patient discharged without antimicrobial prescription and treatment is now indicated [x]  Organism is resistant to prescribed ED discharge antimicrobial []  Patient with positive blood cultures  Changes discussed with ED provider: [facility] New [facility] prescription Amoxicillin 500 mg BID x 7 days [phone] Contacted patient, date 06/21/2016, time 1150   Mark Berger, Mark Berger 06/21/2016, 11:48 AM

## 2016-07-28 ENCOUNTER — Ambulatory Visit: Payer: Medicare Other | Admitting: Podiatry

## 2016-08-18 ENCOUNTER — Ambulatory Visit (INDEPENDENT_AMBULATORY_CARE_PROVIDER_SITE_OTHER): Payer: Medicare Other | Admitting: Podiatry

## 2016-08-18 ENCOUNTER — Encounter: Payer: Self-pay | Admitting: Podiatry

## 2016-08-18 VITALS — BP 126/73 | HR 88 | Resp 14

## 2016-08-18 DIAGNOSIS — M79676 Pain in unspecified toe(s): Secondary | ICD-10-CM

## 2016-08-18 DIAGNOSIS — B351 Tinea unguium: Secondary | ICD-10-CM

## 2016-08-18 NOTE — Progress Notes (Signed)
Patient ID: Mark FlatteryRobert C Berger, male   DOB: 04/01/1924, 82 y.o.   MRN: [id number]    Subjective: This patient presents for scheduled visit again requesting debridement of painful toenails which are uncomfortable walking wearing shoes  Objective: Orientated 3 Patient transfers from wheelchair to treatment chair DP and PT pulses 2/4 bilaterally Capillary reflex immediate bilaterally Sensation to 10 g monofilament wire intact 4/5 right 0/5 left Vibratory sensation nonreactive bilaterally Ankle reflexes reactive bilaterally He says with his knees flexed bilaterally The toenails are hypertrophic, brittle, discolored, incurvated and tender to direct palpation 6-10  Plan: Symptomatic onychomycoses 6-10  Plan: Debrided toenails 10 and mechanically and electrically. Without any bleeding  Reappoint 3 months

## 2016-11-12 ENCOUNTER — Encounter (HOSPITAL_COMMUNITY): Payer: Self-pay

## 2016-11-12 ENCOUNTER — Observation Stay (HOSPITAL_COMMUNITY)
Admission: EM | Admit: 2016-11-12 | Discharge: 2016-11-13 | Disposition: A | Discharge status: Home / Self Care | Payer: Medicare Other | Attending: Internal Medicine | Admitting: Internal Medicine

## 2016-11-12 ENCOUNTER — Other Ambulatory Visit: Payer: Self-pay

## 2016-11-12 ENCOUNTER — Emergency Department (HOSPITAL_COMMUNITY): Payer: Medicare Other

## 2016-11-12 DIAGNOSIS — N390 Urinary tract infection, site not specified: Secondary | ICD-10-CM | POA: Diagnosis not present

## 2016-11-12 DIAGNOSIS — K5730 Diverticulosis of large intestine without perforation or abscess without bleeding: Secondary | ICD-10-CM | POA: Insufficient documentation

## 2016-11-12 DIAGNOSIS — K5900 Constipation, unspecified: Secondary | ICD-10-CM | POA: Insufficient documentation

## 2016-11-12 DIAGNOSIS — M545 Low back pain, unspecified: Secondary | ICD-10-CM | POA: Diagnosis present

## 2016-11-12 DIAGNOSIS — B961 Klebsiella pneumoniae [K. pneumoniae] as the cause of diseases classified elsewhere: Secondary | ICD-10-CM | POA: Insufficient documentation

## 2016-11-12 DIAGNOSIS — J470 Bronchiectasis with acute lower respiratory infection: Secondary | ICD-10-CM | POA: Insufficient documentation

## 2016-11-12 DIAGNOSIS — G8929 Other chronic pain: Secondary | ICD-10-CM | POA: Diagnosis present

## 2016-11-12 DIAGNOSIS — J189 Pneumonia, unspecified organism: Secondary | ICD-10-CM | POA: Diagnosis not present

## 2016-11-12 DIAGNOSIS — K4090 Unilateral inguinal hernia, without obstruction or gangrene, not specified as recurrent: Secondary | ICD-10-CM | POA: Insufficient documentation

## 2016-11-12 DIAGNOSIS — Z66 Do not resuscitate: Secondary | ICD-10-CM | POA: Insufficient documentation

## 2016-11-12 DIAGNOSIS — R112 Nausea with vomiting, unspecified: Secondary | ICD-10-CM | POA: Diagnosis present

## 2016-11-12 DIAGNOSIS — Z888 Allergy status to other drugs, medicaments and biological substances status: Secondary | ICD-10-CM | POA: Insufficient documentation

## 2016-11-12 DIAGNOSIS — F419 Anxiety disorder, unspecified: Secondary | ICD-10-CM | POA: Insufficient documentation

## 2016-11-12 DIAGNOSIS — K449 Diaphragmatic hernia without obstruction or gangrene: Secondary | ICD-10-CM | POA: Insufficient documentation

## 2016-11-12 DIAGNOSIS — E785 Hyperlipidemia, unspecified: Secondary | ICD-10-CM | POA: Diagnosis present

## 2016-11-12 DIAGNOSIS — I119 Hypertensive heart disease without heart failure: Secondary | ICD-10-CM | POA: Insufficient documentation

## 2016-11-12 DIAGNOSIS — Z881 Allergy status to other antibiotic agents status: Secondary | ICD-10-CM | POA: Insufficient documentation

## 2016-11-12 DIAGNOSIS — N400 Enlarged prostate without lower urinary tract symptoms: Secondary | ICD-10-CM | POA: Diagnosis not present

## 2016-11-12 DIAGNOSIS — I2510 Atherosclerotic heart disease of native coronary artery without angina pectoris: Secondary | ICD-10-CM | POA: Diagnosis not present

## 2016-11-12 DIAGNOSIS — G500 Trigeminal neuralgia: Secondary | ICD-10-CM | POA: Insufficient documentation

## 2016-11-12 DIAGNOSIS — M1711 Unilateral primary osteoarthritis, right knee: Secondary | ICD-10-CM | POA: Insufficient documentation

## 2016-11-12 DIAGNOSIS — R739 Hyperglycemia, unspecified: Secondary | ICD-10-CM | POA: Diagnosis not present

## 2016-11-12 DIAGNOSIS — I878 Other specified disorders of veins: Secondary | ICD-10-CM | POA: Insufficient documentation

## 2016-11-12 DIAGNOSIS — I700 Atherosclerosis of aorta: Secondary | ICD-10-CM | POA: Diagnosis not present

## 2016-11-12 DIAGNOSIS — R197 Diarrhea, unspecified: Secondary | ICD-10-CM | POA: Diagnosis not present

## 2016-11-12 DIAGNOSIS — E039 Hypothyroidism, unspecified: Secondary | ICD-10-CM | POA: Insufficient documentation

## 2016-11-12 DIAGNOSIS — Z87442 Personal history of urinary calculi: Secondary | ICD-10-CM | POA: Insufficient documentation

## 2016-11-12 DIAGNOSIS — Z882 Allergy status to sulfonamides status: Secondary | ICD-10-CM | POA: Insufficient documentation

## 2016-11-12 DIAGNOSIS — J90 Pleural effusion, not elsewhere classified: Secondary | ICD-10-CM | POA: Insufficient documentation

## 2016-11-12 DIAGNOSIS — Z96651 Presence of right artificial knee joint: Secondary | ICD-10-CM | POA: Insufficient documentation

## 2016-11-12 DIAGNOSIS — N200 Calculus of kidney: Secondary | ICD-10-CM | POA: Insufficient documentation

## 2016-11-12 DIAGNOSIS — Z8614 Personal history of Methicillin resistant Staphylococcus aureus infection: Secondary | ICD-10-CM | POA: Insufficient documentation

## 2016-11-12 DIAGNOSIS — I10 Essential (primary) hypertension: Secondary | ICD-10-CM | POA: Diagnosis present

## 2016-11-12 DIAGNOSIS — K7689 Other specified diseases of liver: Secondary | ICD-10-CM | POA: Insufficient documentation

## 2016-11-12 HISTORY — DX: Other chronic pain: G89.29

## 2016-11-12 HISTORY — DX: Dorsalgia, unspecified: M54.9

## 2016-11-12 HISTORY — DX: Essential (primary) hypertension: I10

## 2016-11-12 LAB — HEPATIC FUNCTION PANEL
ALBUMIN: 3.3 g/dL — AB (ref 3.5–5.0)
ALK PHOS: 66 U/L (ref 38–126)
ALT: 15 U/L — AB (ref 17–63)
AST: 18 U/L (ref 15–41)
BILIRUBIN INDIRECT: 0.8 mg/dL (ref 0.3–0.9)
Bilirubin, Direct: 0.1 mg/dL (ref 0.1–0.5)
TOTAL PROTEIN: 6 g/dL — AB (ref 6.5–8.1)
Total Bilirubin: 0.9 mg/dL (ref 0.3–1.2)

## 2016-11-12 LAB — URINALYSIS, ROUTINE W REFLEX MICROSCOPIC
Bilirubin Urine: NEGATIVE
GLUCOSE, UA: NEGATIVE mg/dL
Hgb urine dipstick: NEGATIVE
Ketones, ur: 5 mg/dL — AB
Nitrite: POSITIVE — AB
PH: 5 (ref 5.0–8.0)
Protein, ur: NEGATIVE mg/dL
Specific Gravity, Urine: 1.029 (ref 1.005–1.030)
Squamous Epithelial / LPF: NONE SEEN

## 2016-11-12 LAB — COMPREHENSIVE METABOLIC PANEL
ALK PHOS: 66 U/L (ref 38–126)
ALT: 17 U/L (ref 17–63)
AST: 19 U/L (ref 15–41)
Albumin: 3.5 g/dL (ref 3.5–5.0)
Anion gap: 6 (ref 5–15)
BILIRUBIN TOTAL: 1.3 mg/dL — AB (ref 0.3–1.2)
BUN: 32 mg/dL — AB (ref 6–20)
CALCIUM: 8.4 mg/dL — AB (ref 8.9–10.3)
CHLORIDE: 104 mmol/L (ref 101–111)
CO2: 25 mmol/L (ref 22–32)
CREATININE: 0.83 mg/dL (ref 0.61–1.24)
GFR calc Af Amer: >60 mL/min (ref >60)
Glucose, Bld: 153 mg/dL — ABNORMAL HIGH (ref 65–99)
Potassium: 4 mmol/L (ref 3.5–5.1)
Sodium: 135 mmol/L (ref 135–145)
Total Protein: 6.3 g/dL — ABNORMAL LOW (ref 6.5–8.1)

## 2016-11-12 LAB — CBC WITH DIFFERENTIAL/PLATELET
Basophils Absolute: 0 10*3/uL (ref 0.0–0.1)
Basophils Relative: 0 %
EOS ABS: 0 10*3/uL (ref 0.0–0.7)
EOS PCT: 0 %
HCT: 40.3 % (ref 39.0–52.0)
Hemoglobin: 13.5 g/dL (ref 13.0–17.0)
LYMPHS ABS: 0.2 10*3/uL — AB (ref 0.7–4.0)
Lymphocytes Relative: 2 %
MCH: 30.9 pg (ref 26.0–34.0)
MCHC: 33.5 g/dL (ref 30.0–36.0)
MCV: 92.2 fL (ref 78.0–100.0)
Monocytes Absolute: 0.7 10*3/uL (ref 0.1–1.0)
Monocytes Relative: 5 %
Neutro Abs: 12.8 10*3/uL — ABNORMAL HIGH (ref 1.7–7.7)
Neutrophils Relative %: 93 %
PLATELETS: 228 10*3/uL (ref 150–400)
RBC: 4.37 MIL/uL (ref 4.22–5.81)
RDW: 13.7 % (ref 11.5–15.5)
WBC: 13.8 10*3/uL — ABNORMAL HIGH (ref 4.0–10.5)

## 2016-11-12 LAB — MRSA PCR SCREENING: MRSA BY PCR: POSITIVE — AB

## 2016-11-12 LAB — CBC
HEMATOCRIT: 39.1 % (ref 39.0–52.0)
HEMOGLOBIN: 13.1 g/dL (ref 13.0–17.0)
MCH: 30.3 pg (ref 26.0–34.0)
MCHC: 33.5 g/dL (ref 30.0–36.0)
MCV: 90.5 fL (ref 78.0–100.0)
Platelets: 246 10*3/uL (ref 150–400)
RBC: 4.32 MIL/uL (ref 4.22–5.81)
RDW: 13.8 % (ref 11.5–15.5)
WBC: 9 10*3/uL (ref 4.0–10.5)

## 2016-11-12 LAB — LIPASE, BLOOD: Lipase: 18 U/L (ref 11–51)

## 2016-11-12 LAB — CREATININE, SERUM
Creatinine, Ser: 0.74 mg/dL (ref 0.61–1.24)
GFR calc non Af Amer: >60 mL/min (ref >60)

## 2016-11-12 LAB — INFLUENZA PANEL BY PCR (TYPE A & B)
INFLAPCR: NEGATIVE
INFLBPCR: NEGATIVE

## 2016-11-12 LAB — TSH: TSH: 0.665 u[IU]/mL (ref 0.350–4.500)

## 2016-11-12 LAB — I-STAT TROPONIN, ED: Troponin i, poc: 0 ng/mL (ref 0.00–0.08)

## 2016-11-12 LAB — MAGNESIUM: Magnesium: 1.9 mg/dL (ref 1.7–2.4)

## 2016-11-12 LAB — PHOSPHORUS: Phosphorus: 2.2 mg/dL — ABNORMAL LOW (ref 2.5–4.6)

## 2016-11-12 MED ORDER — NON FORMULARY: 8.0000 mg | Freq: Every day | Status: DC

## 2016-11-12 MED ORDER — DEXTROSE 5 % IV SOLN
1.0000 g | INTRAVENOUS | Status: DC
  Administered 2016-11-13: 1 g via INTRAVENOUS
  Filled 2016-11-12: qty 10

## 2016-11-12 MED ORDER — ONDANSETRON HCL 4 MG/2ML IJ SOLN: 4.0000 mg | Freq: Four times a day (QID) | INTRAMUSCULAR | Status: DC | PRN

## 2016-11-12 MED ORDER — NAPROXEN 250 MG PO TABS
250.0000 mg | ORAL_TABLET | Freq: Two times a day (BID) | ORAL | Status: DC
  Administered 2016-11-13: 250 mg via ORAL
  Filled 2016-11-12 (×2): qty 1

## 2016-11-12 MED ORDER — VITAMIN D3 25 MCG (1000 UNIT) PO TABS
1000.0000 [IU] | ORAL_TABLET | Freq: Every day | ORAL | Status: DC
  Administered 2016-11-13: 1000 [IU] via ORAL
  Filled 2016-11-12: qty 1

## 2016-11-12 MED ORDER — DEXTROMETHORPHAN-GUAIFENESIN 10-100 MG/5ML PO SYRP: 5.0000 mL | ORAL_SOLUTION | Freq: Three times a day (TID) | ORAL | Status: DC | PRN

## 2016-11-12 MED ORDER — POLYETHYL GLYCOL-PROPYL GLYCOL 0.4-0.3 % OP SOLN: 1.0000 [drp] | Freq: Four times a day (QID) | OPHTHALMIC | Status: DC

## 2016-11-12 MED ORDER — SALINE SPRAY 0.65 % NA SOLN
1.0000 | NASAL | Status: DC | PRN
  Administered 2016-11-12: 1 via NASAL
  Filled 2016-11-12: qty 44

## 2016-11-12 MED ORDER — HYDROCODONE-ACETAMINOPHEN 5-325 MG PO TABS: 1.0000 | ORAL_TABLET | Freq: Four times a day (QID) | ORAL | Status: DC | PRN

## 2016-11-12 MED ORDER — SILODOSIN 8 MG PO CAPS
8.0000 mg | ORAL_CAPSULE | Freq: Every day | ORAL | Status: DC
  Administered 2016-11-12: 8 mg via ORAL
  Filled 2016-11-12 (×2): qty 1

## 2016-11-12 MED ORDER — CHLORHEXIDINE GLUCONATE CLOTH 2 % EX PADS
6.0000 | MEDICATED_PAD | Freq: Every day | CUTANEOUS | Status: DC
  Administered 2016-11-13: 6 via TOPICAL

## 2016-11-12 MED ORDER — BISACODYL 5 MG PO TBEC: 5.0000 mg | DELAYED_RELEASE_TABLET | Freq: Every day | ORAL | Status: DC | PRN

## 2016-11-12 MED ORDER — ENOXAPARIN SODIUM 40 MG/0.4ML ~~LOC~~ SOLN
40.0000 mg | SUBCUTANEOUS | Status: DC
  Administered 2016-11-12: 40 mg via SUBCUTANEOUS
  Filled 2016-11-12: qty 0.4

## 2016-11-12 MED ORDER — MUPIROCIN 2 % EX OINT
1.0000 "application " | TOPICAL_OINTMENT | Freq: Two times a day (BID) | CUTANEOUS | Status: DC
  Administered 2016-11-12 – 2016-11-13 (×2): 1 via NASAL
  Filled 2016-11-12: qty 22

## 2016-11-12 MED ORDER — DAILY VITE PO TABS: 1.0000 | ORAL_TABLET | Freq: Every day | ORAL | Status: DC

## 2016-11-12 MED ORDER — DEXTROSE 5 % IV SOLN
1.0000 g | Freq: Once | INTRAVENOUS | Status: AC
  Administered 2016-11-12: 1 g via INTRAVENOUS
  Filled 2016-11-12: qty 10

## 2016-11-12 MED ORDER — ONDANSETRON HCL 4 MG/2ML IJ SOLN
4.0000 mg | Freq: Once | INTRAMUSCULAR | Status: AC
  Administered 2016-11-12: 4 mg via INTRAVENOUS
  Filled 2016-11-12: qty 2

## 2016-11-12 MED ORDER — NAPROXEN SODIUM 220 MG PO TABS: 220.0000 mg | ORAL_TABLET | Freq: Two times a day (BID) | ORAL | Status: DC

## 2016-11-12 MED ORDER — ADULT MULTIVITAMIN W/MINERALS CH
1.0000 | ORAL_TABLET | Freq: Every day | ORAL | Status: DC
  Administered 2016-11-13: 1 via ORAL
  Filled 2016-11-12: qty 1

## 2016-11-12 MED ORDER — ATORVASTATIN CALCIUM 10 MG PO TABS
10.0000 mg | ORAL_TABLET | Freq: Every day | ORAL | Status: DC
  Administered 2016-11-12: 10 mg via ORAL
  Filled 2016-11-12: qty 1

## 2016-11-12 MED ORDER — ACETAMINOPHEN 325 MG PO TABS: 650.0000 mg | ORAL_TABLET | Freq: Four times a day (QID) | ORAL | Status: DC | PRN

## 2016-11-12 MED ORDER — AMLODIPINE BESYLATE 5 MG PO TABS
5.0000 mg | ORAL_TABLET | Freq: Every day | ORAL | Status: DC
  Administered 2016-11-13: 5 mg via ORAL
  Filled 2016-11-12: qty 1

## 2016-11-12 MED ORDER — SODIUM CHLORIDE 0.9 % IV SOLN
INTRAVENOUS | Status: AC
  Administered 2016-11-12: 20:00:00 via INTRAVENOUS

## 2016-11-12 MED ORDER — GUAIFENESIN-DM 100-10 MG/5ML PO SYRP: 5.0000 mL | ORAL_SOLUTION | Freq: Three times a day (TID) | ORAL | Status: DC | PRN

## 2016-11-12 MED ORDER — POLYVINYL ALCOHOL 1.4 % OP SOLN
1.0000 [drp] | Freq: Four times a day (QID) | OPHTHALMIC | Status: DC
  Administered 2016-11-12 – 2016-11-13 (×3): 1 [drp] via OPHTHALMIC
  Filled 2016-11-12: qty 15

## 2016-11-12 MED ORDER — LORAZEPAM 0.5 MG PO TABS
0.5000 mg | ORAL_TABLET | Freq: Two times a day (BID) | ORAL | Status: DC
  Administered 2016-11-12 – 2016-11-13 (×2): 0.5 mg via ORAL
  Filled 2016-11-12 (×2): qty 1

## 2016-11-12 MED ORDER — DEXTROSE 5 % IV SOLN
500.0000 mg | INTRAVENOUS | Status: DC
  Administered 2016-11-12: 500 mg via INTRAVENOUS
  Filled 2016-11-12: qty 500

## 2016-11-12 MED ORDER — SODIUM CHLORIDE 0.9 % IV BOLUS (SEPSIS)
500.0000 mL | Freq: Once | INTRAVENOUS | Status: AC
  Administered 2016-11-12: 500 mL via INTRAVENOUS

## 2016-11-12 MED ORDER — IOPAMIDOL (ISOVUE-300) INJECTION 61%
100.0000 mL | Freq: Once | INTRAVENOUS | Status: AC | PRN
  Administered 2016-11-12: 100 mL via INTRAVENOUS

## 2016-11-12 MED ORDER — IOPAMIDOL (ISOVUE-300) INJECTION 61%
INTRAVENOUS | Status: AC
  Administered 2016-11-12: 100 mL via INTRAVENOUS
  Filled 2016-11-12: qty 100

## 2016-11-12 MED ORDER — IPRATROPIUM-ALBUTEROL 0.5-2.5 (3) MG/3ML IN SOLN: 3.0000 mL | Freq: Four times a day (QID) | RESPIRATORY_TRACT | Status: DC | PRN

## 2016-11-12 MED ORDER — ONDANSETRON HCL 4 MG PO TABS: 4.0000 mg | ORAL_TABLET | Freq: Four times a day (QID) | ORAL | Status: DC | PRN

## 2016-11-12 NOTE — ED Triage Notes (Addendum)
[facility] for 6 hours no pain no fever no other complaints.

## 2016-11-12 NOTE — ED Provider Notes (Signed)
[facility]-EMERGENCY DEPT Provider Note   CSN: [id number] Arrival date & time: 11/12/16  7829   By signing my name below, I, Nelwyn Salisbury, attest that this documentation has been prepared under the direction and in the presence of Tilden Fossa, MD . Electronically Signed: Nelwyn Salisbury, Scribe. 11/12/2016. 3:31 AM.  History   Chief Complaint Chief Complaint  Patient presents with  . Nausea  . Emesis  . Diarrhea   The history is provided by the patient. No language interpreter was used.    HPI Comments:  Mark Berger is a 83 y.o. male with pmhx of hypothyroid who presents to the Emergency Department complaining of sudden-onset, frequent vomiting beginning 8 hours ago. He notes that he vomited two "milkshake sized cups" and simultaneously voided on himself. Pt reports associated nausea, diarrhea, and cough. He has not tried anything for his symptoms. Pt denies any chest pain, or constipation.   Past Medical History:  Diagnosis Date  . Anxiety   . Arthritis   . BPH (benign prostatic hyperplasia)   . Hypothyroid   . Kidney calculi   . Pneumonia    history of x3  . Right knee DJD   . Shortness of breath    exertion  . Trigeminal neuralgia   . Urinary tract infection     Patient Active Problem List   Diagnosis Date Noted  . Boil on right hamstring 09/19/2012  . Shortness of breath   . Anxiety   . BPH (benign prostatic hyperplasia)   . MRSA (methicillin resistant Staphylococcus aureus) carrier 08/11/2012  . Arthritis   . Kidney calculi   . Right knee DJD   . Syncope 12/19/2011    Class: Acute  . Trigeminal neuralgia 12/19/2011  . Osteoarthritis of knee 12/19/2011  . Urinary tract infection 12/19/2011    Class: Acute  . Benign prostate hyperplasia 12/19/2011  . Chronic low back pain 12/19/2011    Class: Chronic    Past Surgical History:  Procedure Laterality Date  . EYE SURGERY     bilateral  . HEMORRHOID SURGERY    . HERNIA REPAIR    . TOTAL KNEE  ARTHROPLASTY  09/19/2012   Procedure: TOTAL KNEE ARTHROPLASTY;  Surgeon: Nilda Simmer, MD;  Location: [facility] OR;  Service: Orthopedics;  Laterality: Right;  right total knee arthroplasty       Home Medications    Prior to Admission medications   Medication Sig Start Date End Date Taking? Authorizing Provider  acetaminophen (TYLENOL) 325 MG tablet Take 2 tablets (650 mg total) by mouth every 4 (four) hours as needed for pain (or Fever >/= 101). Patient not taking: Reported on 06/18/2016 09/22/12   Kirstin Shepperson, PA-C  acetaminophen (TYLENOL) 500 MG tablet Take 1,000 mg by mouth every 4 (four) hours as needed for mild pain, moderate pain or fever.    Historical Provider, MD  amLODipine (NORVASC) 5 MG tablet Take 5 mg by mouth daily.  12/19/15   Historical Provider, MD  atorvastatin (LIPITOR) 10 MG tablet Take 10 mg by mouth at bedtime.     Historical Provider, MD  bisacodyl (DULCOLAX) 5 MG EC tablet Take 5 mg by mouth daily as needed for mild constipation or moderate constipation.    Historical Provider, MD  cholecalciferol (VITAMIN D) 1000 UNITS tablet Take 1,000 Units by mouth daily.    Historical Provider, MD  Dextromethorphan-Guaifenesin (ROBAFEN DM) 10-100 MG/5ML liquid Take 5 mLs by mouth as directed.    Historical Provider, MD  docusate sodium  100 MG CAPS Take 100 mg by mouth 2 (two) times daily. Patient not taking: Reported on 06/18/2016 09/22/12   Kirstin Shepperson, PA-C  HYDROcodone-acetaminophen (NORCO/VICODIN) 5-325 MG tablet Take 1 tablet by mouth every 4 (four) hours as needed for pain. 03/16/16   Historical Provider, MD  LORazepam (ATIVAN) 0.5 MG tablet Take 0.5 mg by mouth at bedtime.  01/07/16   Historical Provider, MD  Multiple Vitamin (DAILY VITE) TABS Take 1 tablet by mouth daily.     Historical Provider, MD  naproxen sodium (ANAPROX) 220 MG tablet Take 220 mg by mouth 2 (two) times daily with a meal.    Historical Provider, MD  ondansetron (ZOFRAN ODT) 8 MG disintegrating  tablet Take 1 tablet (8 mg total) by mouth every 8 (eight) hours as needed for nausea. Patient not taking: Reported on 06/18/2016 01/29/13   Devoria Albe, MD  Polyethyl Glycol-Propyl Glycol 0.4-0.3 % SOLN Apply 1 drop to eye 4 (four) times daily. Both eyes    Historical Provider, MD  Powders (ANTI MONKEY BUTT EX) Apply 1 application topically 2 (two) times daily as needed (rash).    Historical Provider, MD  RAPAFLO 8 MG CAPS capsule Take 8 mg by mouth every evening. 06/14/16   Historical Provider, MD  senna (SENOKOT) 8.6 MG TABS Take 2 tablets by mouth 2 (two) times daily.     Historical Provider, MD  Tamsulosin HCl (FLOMAX) 0.4 MG CAPS Take 1 capsule (0.4 mg total) by mouth daily. 02/16/12   Marisa Severin, MD  VENTOLIN HFA 108 (90 Base) MCG/ACT inhaler Inhale 2 puffs into the lungs every 6 (six) hours as needed for wheezing. 04/08/16   Historical Provider, MD  ZINC OXIDE, TOPICAL, (SECURA PROTECTIVE) 10 % CREA Apply 1 application topically at bedtime as needed (raised red areas of bilateral buttocks).    Historical Provider, MD    Family History History reviewed. No pertinent family history.  Social History Social History  Substance Use Topics  . Smoking status: Never Smoker  . Smokeless tobacco: Never Used  . Alcohol use No     Allergies   Altace [ramipril]; Bactrim [sulfamethoxazole-trimethoprim]; and Levaquin [levofloxacin in d5w]   Review of Systems Review of Systems  Cardiovascular: Negative for chest pain.  Gastrointestinal: Positive for diarrhea, nausea and vomiting. Negative for constipation.  All other systems reviewed and are negative.    Physical Exam Updated Vital Signs BP 133/72 (BP Location: Left Arm)   Pulse 102   Temp 97.8 F (36.6 C) (Oral)   Resp 17   Ht 6' (1.829 m)   Wt 224 lb (101.6 kg)   SpO2 96%   BMI 30.38 kg/m   Physical Exam  Constitutional: He is oriented to person, place, and time. He appears well-developed and well-nourished.  HENT:  Head:  Normocephalic and atraumatic.  Cardiovascular: Regular rhythm.  Tachycardia present.   No murmur heard. Pulmonary/Chest: Effort normal and breath sounds normal. No respiratory distress.  Abdominal: Soft. There is no tenderness. There is no rebound and no guarding.  Musculoskeletal: He exhibits no edema or tenderness.  Neurological: He is alert and oriented to person, place, and time.  Skin: Skin is warm and dry.  Psychiatric: He has a normal mood and affect. His behavior is normal.  Nursing note and vitals reviewed.    ED Treatments / Results  DIAGNOSTIC STUDIES:  Oxygen Saturation is 95% on RA, adequate by my interpretation.    COORDINATION OF CARE:  3:37 AM Discussed treatment plan with pt at  bedside which includes nausea medication and IV fluids and pt agreed to plan.  Labs (all labs ordered are listed, but only abnormal results are displayed) Labs Reviewed  COMPREHENSIVE METABOLIC PANEL - Abnormal; Notable for the following:       Result Value   Glucose, Bld 153 (*)    BUN 32 (*)    Calcium 8.4 (*)    Total Protein 6.3 (*)    Total Bilirubin 1.3 (*)    All other components within normal limits  CBC WITH DIFFERENTIAL/PLATELET - Abnormal; Notable for the following:    WBC 13.8 (*)    Neutro Abs 12.8 (*)    Lymphs Abs 0.2 (*)    All other components within normal limits  URINALYSIS, ROUTINE W REFLEX MICROSCOPIC - Abnormal; Notable for the following:    APPearance HAZY (*)    Ketones, ur 5 (*)    Nitrite POSITIVE (*)    Leukocytes, UA MODERATE (*)    Bacteria, UA MANY (*)    All other components within normal limits  URINE CULTURE  LIPASE, BLOOD  I-STAT TROPOININ, ED    EKG  EKG Interpretation None       Radiology Ct Abdomen Pelvis W Contrast  Result Date: 11/12/2016 CLINICAL DATA:  Nausea, vomiting, and diarrhea for several hours. White cell count 13.8. EXAM: CT ABDOMEN AND PELVIS WITH CONTRAST TECHNIQUE: Multidetector CT imaging of the abdomen and pelvis  was performed using the standard protocol following bolus administration of intravenous contrast. CONTRAST:  100 mL Isovue-300 COMPARISON:  02/11/2012 FINDINGS: Lower chest: Minimal bilateral pleural effusions. Interstitial infiltrates in the lung bases could represent pneumonia or edema. Mild bronchiectasis. Coronary artery calcifications. Cardiac enlargement. Small esophageal hiatal hernia. Hepatobiliary: Several circumscribed cysts in the liver, largest measuring 1.3 cm diameter. Gallbladder and bile ducts are unremarkable. Pancreas: Unremarkable. No pancreatic ductal dilatation or surrounding inflammatory changes. Spleen: Normal in size without focal abnormality. Adrenals/Urinary Tract: No adrenal gland nodules. Parapelvic cysts in the kidneys. Punctate size nonobstructing stones in the right kidney. No hydronephrosis or hydroureter. Bladder wall is not thickened and no bladder stones are identified. Stomach/Bowel: Diverticulosis of the colon. Stomach, small bowel, and colon are not abnormally distended. No inflammatory changes or wall thickening appreciated. Vascular/Lymphatic: Aortic atherosclerosis. No enlarged abdominal or pelvic lymph nodes. Reproductive: Prostate gland is enlarged measuring 6.8 cm in diameter. Other: Small periumbilical and left inguinal hernias containing fat. No free air or free fluid in the abdomen. Musculoskeletal: Degenerative changes in the spine. Compression this is unchanged since prior study. Of T12. IMPRESSION: Minimal bilateral pleural effusions with interstitial infiltrates in the lung bases likely representing edema although pneumonia could also have this appearance. Punctate sized nonobstructing stones in the right kidney. No evidence of bowel obstruction or inflammation. Enlarged prostate gland. Small periumbilical and left inguinal hernias containing fat. Electronically Signed   By: Burman Nieves M.D.   On: 11/12/2016 06:44    Procedures Procedures (including  critical care time)  Medications Ordered in ED Medications  cefTRIAXone (ROCEPHIN) 1 g in dextrose 5 % 50 mL IVPB (not administered)  sodium chloride 0.9 % bolus 500 mL (0 mLs Intravenous Stopped 11/12/16 0428)  ondansetron (ZOFRAN) injection 4 mg (4 mg Intravenous Given 11/12/16 0351)  iopamidol (ISOVUE-300) 61 % injection 100 mL (100 mLs Intravenous Contrast Given 11/12/16 0616)     Initial Impression / Assessment and Plan / ED Course  I have reviewed the triage vital signs and the nursing notes.  Pertinent labs & imaging  results that were available during my care of the patient were reviewed by me and considered in my medical decision making (see chart for details).  Clinical Course     Patient here for evaluation of sudden onset vomiting and diarrhea. Given histiocytosis and vomiting a CT abdomen was obtained that was negative for bowel obstruction. CT scan did show possible pneumonia versus pulmonary edema. Patient with no respiratory symptoms, clear lungs but he does have oxygen saturations that dropped into the low 90s at times. UA is concerning for UTI, we will send cultures and treat for urinary tract infection. Hospitalist consultated for admission for further treatment.  Final Clinical Impressions(s) / ED Diagnoses   Final diagnoses:  Nausea vomiting and diarrhea  Acute UTI    New Prescriptions New Prescriptions   No medications on file  I personally performed the services described in this documentation, which was scribed in my presence. The recorded information has been reviewed and is accurate.    Tilden FossaElizabeth Ambers Iyengar, MD 11/12/16 [phone]

## 2016-11-12 NOTE — Clinical Social Work Note (Signed)
Clinical Social Work Assessment  Patient Details  Name: Mark FlatteryRobert C Berger MRN: [id number] Date of Birth: 08/10/1924  Date of referral:  11/12/16               Reason for consult:  Facility Placement, Discharge Planning                Permission sought to share information with:  Facility Industrial/product designerContact Representative Permission granted to share information::  Yes, Verbal Permission Granted  Name::        Agency::     Relationship::     Contact Information:     Housing/Transportation Living arrangements for the past 2 months:  Assisted Living Facility ([facility]) Source of Information:  Patient Patient Interpreter Needed:  None Criminal Activity/Legal Involvement Pertinent to Current Situation/Hospitalization:    Significant Relationships:  None Lives with:  Facility Resident ([facility]) Do you feel safe going back to the place where you live?  Yes Need for family participation in patient care:  Yes (Comment)  Care giving concerns:  No current concerns.   Social Worker assessment / plan:  CSW spoke with patient via bedside regarding discharge plans. Patient has been a resident at [facility] for the past 4 years. Patient primarily uses a wheel chair to get around but is able to us a walker. Patient states "it is safer to use my wheel chair". Patient did not have any friends/ family members for CSW to contact; stating "everyone I know has already died". Patient would like to return to [facility] once medically stable for discharge.   Employment status:  Retired Health and safety inspectornsurance information:  Managed Care PT Recommendations:  Not assessed at this time Information / Referral to community resources:     Patient/Family's Response to care: Patient appreciated CSW.  Patient/Family's Understanding of and Emotional Response to Diagnosis, Current Treatment, and Prognosis:  Unknown at this time.   Emotional Assessment Appearance:  Appears stated age Attitude/Demeanor/Rapport:     Affect (typically observed):  Pleasant, Appropriate, Calm Orientation:  Oriented to Self, Oriented to Place, Oriented to  Time, Oriented to Situation Alcohol / Substance use:    Psych involvement (Current and /or in the community):  No (Comment)  Discharge Needs  Concerns to be addressed:  No discharge needs identified Readmission within the last 30 days:  No Current discharge risk:  None Barriers to Discharge:  No Barriers Identified   Mark Coffinrin Mark Sakiyah Shur, LCSW 11/12/2016, 11:32 AM

## 2016-11-12 NOTE — ED Notes (Signed)
Social worker at bedside.

## 2016-11-12 NOTE — ED Notes (Signed)
Urine sent to lab alert and oriented call light in reach no respiratory or acute distress noted.

## 2016-11-12 NOTE — ED Notes (Signed)
EKG given to EDP,Rees,MD., for review. 

## 2016-11-12 NOTE — H&P (Signed)
History and Physical    Mark Berger ZOX:[id number] DOB: 1924-01-31 DOA: 11/12/2016  Referring Provider: Dr. Madilyn Hook PCP: Gaspar Garbe, MD   Patient coming from: ALF ([facility])  Chief Complaint: nausea, vomiting, diarrhea and general malaise   HPI: Mark Berger is a 83 y.o. male with PMH significant for hypothyroidism, BPH, HTN, DJD and chronic lower back pain; who presented with sudden onset of nausea, non bloody vomiting, diarrhea and general malaise. Upon questioning he expresses also coughing spells and chills (didn't check temperature). Patient endorses intermittent suprapubic pain (Patients abd pain is crampy, intermittent, relief with urination and w/o associated worsening factors) and frequency. There has not been hematuria, hematemesis, hematochezia, melena, HA's, blurred vision, CP, SOB or any other complaints.  Of note, on day of admission was having difficulty keeping things down (including medications).  ED Course: patient found to be slightly dehydrated, with elevated WBC's and actively throwing up, with difficulty keeping things down. Found to have abnormal UA and early PNA process on CXR and CT abdomen. Cultures taken and patient started on IV antibiotics. TRH contacted to place in observation for further evaluation and treatment.   Review of Systems:  All other systems reviewed and apart from HPI, are negative.  Past Medical History:  Diagnosis Date  . Anxiety   . Arthritis   . BPH (benign prostatic hyperplasia)   . Hypothyroid   . Kidney calculi   . Pneumonia    history of x3  . Right knee DJD   . Shortness of breath    exertion  . Trigeminal neuralgia   . Urinary tract infection     Past Surgical History:  Procedure Laterality Date  . EYE SURGERY     bilateral  . HEMORRHOID SURGERY    . HERNIA REPAIR    . TOTAL KNEE ARTHROPLASTY  09/19/2012   Procedure: TOTAL KNEE ARTHROPLASTY;  Surgeon: Nilda Simmer, MD;  Location: [facility] OR;   Service: Orthopedics;  Laterality: Right;  right total knee arthroplasty     reports that he has never smoked. He has never used smokeless tobacco. He reports that he does not drink alcohol or use drugs.  Allergies  Allergen Reactions  . Altace [Ramipril] Other (See Comments)    Reaction unknown  . Bactrim [Sulfamethoxazole-Trimethoprim] Rash  . Levaquin [Levofloxacin In D5w] Rash    History reviewed. No pertinent family history.   Prior to Admission medications   Medication Sig Start Date End Date Taking? Authorizing Provider  acetaminophen (MAPAP) 500 MG tablet Take 1,000 mg by mouth every 4 (four) hours as needed (For pain or fever over 101.).   Yes Historical Provider, MD  amLODipine (NORVASC) 5 MG tablet Take 5 mg by mouth daily.  12/19/15  Yes Historical Provider, MD  atorvastatin (LIPITOR) 10 MG tablet Take 10 mg by mouth at bedtime.    Yes Historical Provider, MD  bisacodyl (DULCOLAX) 5 MG EC tablet Take 5 mg by mouth daily as needed (For constipation.).    Yes Historical Provider, MD  cholecalciferol (VITAMIN D) 1000 UNITS tablet Take 1,000 Units by mouth daily.   Yes Historical Provider, MD  Dextromethorphan-Guaifenesin (ROBAFEN DM) 10-100 MG/5ML liquid Take 5 mLs by mouth as directed.   Yes Historical Provider, MD  HYDROcodone-acetaminophen (NORCO/VICODIN) 5-325 MG tablet Take 1 tablet by mouth every 4 (four) hours as needed for pain. 03/16/16  Yes Historical Provider, MD  LORazepam (ATIVAN) 0.5 MG tablet Take 0.5 mg by mouth 2 (two)  times daily.  01/07/16  Yes Historical Provider, MD  Multiple Vitamin (DAILY VITE) TABS Take 1 tablet by mouth daily.    Yes Historical Provider, MD  naproxen sodium (ALL DAY RELIEF) 220 MG tablet Take 220 mg by mouth 2 (two) times daily with a meal.   Yes Historical Provider, MD  Polyethyl Glycol-Propyl Glycol (SYSTANE) 0.4-0.3 % SOLN Place 1 drop into both eyes 4 (four) times daily.   Yes Historical Provider, MD  Powders (ANTI MONKEY BUTT EX) Apply  1 application topically 2 (two) times daily as needed (rash).   Yes Historical Provider, MD  RAPAFLO 8 MG CAPS capsule Take 8 mg by mouth every evening. 06/14/16  Yes Historical Provider, MD  senna (SENOKOT) 8.6 MG TABS Take 2 tablets by mouth 2 (two) times daily.    Yes Historical Provider, MD  VENTOLIN HFA 108 (90 Base) MCG/ACT inhaler Inhale 2 puffs into the lungs every 6 (six) hours as needed for wheezing. 04/08/16  Yes Historical Provider, MD  ZINC OXIDE, TOPICAL, (SECURA PROTECTIVE) 10 % CREA Apply 1 application topically at bedtime as needed (raised red areas of bilateral buttocks).   Yes Historical Provider, MD    Physical Exam: Vitals:   11/12/16 0500 11/12/16 0530 11/12/16 0658 11/12/16 0925  BP: 114/61 123/63 133/72 132/68  Pulse: 98 101 102 76  Resp: 26 21 17 18   Temp:   97.8 F (36.6 C) 97.2 F (36.2 C)  TempSrc:   Oral Oral  SpO2: 92% 93% 96% 98%  Weight:      Height:       Constitutional: NAD, calm, comfortable overall. Patient experienced 2 episodes of vomiting while in ED. Denies CP. Endorses coughing spells, but no SOB. Eyes: PERTLA, lids and conjunctivae normal, no icterus, no nystagmus. ENMT: hard of hearing, Mucous membranes dry on exam. Posterior pharynx clear of any exudate or lesions. Fair dentition.  Neck: normal, supple, no masses, no thyromegaly, no JVD Respiratory: no wheezing, no crackles, positive scattered rhonchic. Normal respiratory effort. No accessory muscle use.  Cardiovascular: S1 & S2 heard, regular rate and rhythm, no murmurs / rubs / gallops. No extremity edema. 2+ pedal pulses.  Abdomen: No distension, no tenderness (but described intermittent episodes of suprapubic discomfort), no masses palpated. No hepatosplenomegaly. Bowel sounds normal.  Musculoskeletal: no clubbing / cyanosis. No joint deformity upper and lower extremities. Good ROM, no contractures. Normal muscle tone.  Skin: no rashes, lesions, ulcers. No induration or open wounds appreciated  on exam Neurologic: CN 2-12 grossly intact. Sensation intact, DTR normal. Strength 5/5 in all 4 limbs. Psychiatric: Normal judgment and insight. Alert and oriented x 3. Normal mood.   Labs on Admission: I have personally reviewed following labs and imaging studies  CBC:  Recent Labs Lab 11/12/16 0405  WBC 13.8*  NEUTROABS 12.8*  HGB 13.5  HCT 40.3  MCV 92.2  PLT 228   Basic Metabolic Panel:  Recent Labs Lab 11/12/16 0405  NA 135  K 4.0  CL 104  CO2 25  GLUCOSE 153*  BUN 32*  CREATININE 0.83  CALCIUM 8.4*   GFR: Estimated Creatinine Clearance: 73 mL/min (by C-G formula based on SCr of 0.83 mg/dL).   Liver Function Tests:  Recent Labs Lab 11/12/16 0405  AST 19  ALT 17  ALKPHOS 66  BILITOT 1.3*  PROT 6.3*  ALBUMIN 3.5    Recent Labs Lab 11/12/16 0405  LIPASE 18   Urine analysis:    Component Value Date/Time   COLORURINE YELLOW  11/12/2016 0655   APPEARANCEUR HAZY (A) 11/12/2016 0655   LABSPEC 1.029 11/12/2016 0655   PHURINE 5.0 11/12/2016 0655   GLUCOSEU NEGATIVE 11/12/2016 0655   HGBUR NEGATIVE 11/12/2016 0655   BILIRUBINUR NEGATIVE 11/12/2016 0655   KETONESUR 5 (A) 11/12/2016 0655   PROTEINUR NEGATIVE 11/12/2016 0655   UROBILINOGEN 0.2 01/29/2013 1220   NITRITE POSITIVE (A) 11/12/2016 0655   LEUKOCYTESUR MODERATE (A) 11/12/2016 0655   Radiological Exams on Admission: Dg Chest 2 View  Result Date: 11/12/2016 CLINICAL DATA:  Nausea, vomiting diarrhea cough and shortness of breath for the past day. EXAM: CHEST  2 VIEW COMPARISON:  08/15/2012; 08/10/2012; chest CT - 08/11/2012 FINDINGS: Grossly unchanged cardiac silhouette and mediastinal contours given reduced lung volumes. There is persistent rightward deviation of the tracheal air, the level of the aortic arch. Atherosclerotic plaque within the thoracic aorta. Mild pulmonary is congestion without frank evidence of edema. Worsening perihilar and bibasilar opacities favored to represent atelectasis.  Potential developing airspace opacity with the peripheral aspect the right upper lung. No pleural effusion pneumothorax. No evidence of edema. No acute osseus abnormalities. IMPRESSION: 1. Potential developing airspace opacity with the peripheral aspect the right upper lung. A follow-up chest radiograph in 3 to 4 weeks after treatment is recommended to ensure resolution. 2. Decreased lung volumes with bibasilar opacities, likely atelectasis. 3. Pulmonary venous congestion without frank evidence of edema. 4. Aortic Atherosclerosis (ICD10-170.0) Electronically Signed   By: Simonne Come M.D.   On: 11/12/2016 07:53   Ct Abdomen Pelvis W Contrast  Result Date: 11/12/2016 CLINICAL DATA:  Nausea, vomiting, and diarrhea for several hours. White cell count 13.8. EXAM: CT ABDOMEN AND PELVIS WITH CONTRAST TECHNIQUE: Multidetector CT imaging of the abdomen and pelvis was performed using the standard protocol following bolus administration of intravenous contrast. CONTRAST:  100 mL Isovue-300 COMPARISON:  02/11/2012 FINDINGS: Lower chest: Minimal bilateral pleural effusions. Interstitial infiltrates in the lung bases could represent pneumonia or edema. Mild bronchiectasis. Coronary artery calcifications. Cardiac enlargement. Small esophageal hiatal hernia. Hepatobiliary: Several circumscribed cysts in the liver, largest measuring 1.3 cm diameter. Gallbladder and bile ducts are unremarkable. Pancreas: Unremarkable. No pancreatic ductal dilatation or surrounding inflammatory changes. Spleen: Normal in size without focal abnormality. Adrenals/Urinary Tract: No adrenal gland nodules. Parapelvic cysts in the kidneys. Punctate size nonobstructing stones in the right kidney. No hydronephrosis or hydroureter. Bladder wall is not thickened and no bladder stones are identified. Stomach/Bowel: Diverticulosis of the colon. Stomach, small bowel, and colon are not abnormally distended. No inflammatory changes or wall thickening  appreciated. Vascular/Lymphatic: Aortic atherosclerosis. No enlarged abdominal or pelvic lymph nodes. Reproductive: Prostate gland is enlarged measuring 6.8 cm in diameter. Other: Small periumbilical and left inguinal hernias containing fat. No free air or free fluid in the abdomen. Musculoskeletal: Degenerative changes in the spine. Compression this is unchanged since prior study. Of T12. IMPRESSION: Minimal bilateral pleural effusions with interstitial infiltrates in the lung bases likely representing edema although pneumonia could also have this appearance. Punctate sized nonobstructing stones in the right kidney. No evidence of bowel obstruction or inflammation. Enlarged prostate gland. Small periumbilical and left inguinal hernias containing fat. Electronically Signed   By: Burman Nieves M.D.   On: 11/12/2016 06:44    EKG:  Sinus rhythm, mild tachycardia, no acute ischemic changes.  Assessment/Plan 1-nausea, vomiting and diarrhea: with associated intermittent suprapubic discomfort and increase frequency. Also with associated subjective fever and abnormal UA. -will treat for UTI -patient at high risk for infections due to BPH -  will provide IVF's and treat with rocephin  -will provide supportive care and PEN antiemetics -hemodynamically stable. -follow urine culture -will start with full liquid diet and advance as tolerated   2-subjective fever, coughing and abnormal x-rays: patient with subjective fever, intermittent cough and general malaise; also with abnormal CXR and abnormal lung findings on CT abdomen, both suggesting early PNA process. -will follow PNA protocol  -will treat with rocephin and zithromax -will check Flu panel, strep pneumo and legionella antigen in urine  -will use flutter valve and PRN duoneb  3-Chronic low back pain -stale -will continue home analgesic regimen   4-BPH (benign prostatic hyperplasia) -will continue home Rapaflo; according to he has failed therapy  with flomax   5-Acute UTI: suspected, with suprapubic pain intermittently, increase frequency, elevated WBC's, and abnormal UA. -as mentioned above with treat with rocephin -IVF's and supportive care -will follow urine culture -no hematuria appreciated  6-HTN (hypertension) -will continue norvasc -BP is stable  7-HLD (hyperlipidemia) -will continue lipitor  8-Hyperglycemia: no prior hx of diabetes -will check A1C and repeat CBG fasting in am  9-history of hypothyroidism -will check TSH -was not on synthroid currently  Time: 55 minutes   DVT prophylaxis: Lovenox   Code Status: DNR Family Communication: no family at bedside   Disposition Plan: to be determined; will have PT assess patient capability of taking care of himself (is from ALF); anticipate discharge no longer than 2 midnights. Needs to be able to tolerates by mouth.  Consults called: none  Admission status: observation, LOS < 2 midnights; med-surg    Vassie Loll MD [facility] Pager [phone]  If 7PM-7AM, please contact night-coverage [email or web address] Password [email or web address]  11/12/2016, 9:59 AM

## 2016-11-12 NOTE — ED Notes (Signed)
Assisted pt in using bedpan with small amount of liquid stool noted pt unable to urinate at this time.

## 2016-11-12 NOTE — ED Notes (Signed)
Cleaned up pt due to small liquid bowel movement no skin breakdown noted call light in reach still  Unable to give urine specimen.

## 2016-11-12 NOTE — ED Notes (Signed)
Patient transported to X-ray 

## 2016-11-13 ENCOUNTER — Encounter (HOSPITAL_COMMUNITY): Payer: Self-pay

## 2016-11-13 DIAGNOSIS — N390 Urinary tract infection, site not specified: Secondary | ICD-10-CM | POA: Diagnosis not present

## 2016-11-13 DIAGNOSIS — J189 Pneumonia, unspecified organism: Secondary | ICD-10-CM | POA: Diagnosis not present

## 2016-11-13 DIAGNOSIS — I10 Essential (primary) hypertension: Secondary | ICD-10-CM

## 2016-11-13 DIAGNOSIS — R197 Diarrhea, unspecified: Secondary | ICD-10-CM

## 2016-11-13 DIAGNOSIS — E785 Hyperlipidemia, unspecified: Secondary | ICD-10-CM

## 2016-11-13 DIAGNOSIS — R112 Nausea with vomiting, unspecified: Secondary | ICD-10-CM | POA: Diagnosis not present

## 2016-11-13 DIAGNOSIS — N400 Enlarged prostate without lower urinary tract symptoms: Secondary | ICD-10-CM | POA: Diagnosis not present

## 2016-11-13 LAB — CBC
HCT: 36.8 % — ABNORMAL LOW (ref 39.0–52.0)
Hemoglobin: 12.2 g/dL — ABNORMAL LOW (ref 13.0–17.0)
MCH: 30.1 pg (ref 26.0–34.0)
MCHC: 33.2 g/dL (ref 30.0–36.0)
MCV: 90.9 fL (ref 78.0–100.0)
Platelets: 215 K/uL (ref 150–400)
RBC: 4.05 MIL/uL — ABNORMAL LOW (ref 4.22–5.81)
RDW: 14 % (ref 11.5–15.5)
WBC: 6.2 K/uL (ref 4.0–10.5)

## 2016-11-13 LAB — HEMOGLOBIN A1C
HEMOGLOBIN A1C: 6 % — AB (ref 4.8–5.6)
MEAN PLASMA GLUCOSE: 126 mg/dL

## 2016-11-13 LAB — BASIC METABOLIC PANEL WITH GFR
Anion gap: 5 (ref 5–15)
BUN: 19 mg/dL (ref 6–20)
CO2: 25 mmol/L (ref 22–32)
Calcium: 7.9 mg/dL — ABNORMAL LOW (ref 8.9–10.3)
Chloride: 108 mmol/L (ref 101–111)
Creatinine, Ser: 0.63 mg/dL (ref 0.61–1.24)
GFR calc Af Amer: >60 mL/min
GFR calc non Af Amer: >60 mL/min
Glucose, Bld: 106 mg/dL — ABNORMAL HIGH (ref 65–99)
Potassium: 3.7 mmol/L (ref 3.5–5.1)
Sodium: 138 mmol/L (ref 135–145)

## 2016-11-13 LAB — STREP PNEUMONIAE URINARY ANTIGEN: STREP PNEUMO URINARY ANTIGEN: NEGATIVE

## 2016-11-13 MED ORDER — AZITHROMYCIN 250 MG PO TABS
500.0000 mg | ORAL_TABLET | Freq: Every day | ORAL | Status: DC
  Administered 2016-11-13: 500 mg via ORAL
  Filled 2016-11-13: qty 2

## 2016-11-13 MED ORDER — CEFPODOXIME PROXETIL 200 MG PO TABS: 200.0000 mg | ORAL_TABLET | Freq: Two times a day (BID) | ORAL | 0 refills | Status: AC

## 2016-11-13 MED ORDER — AZITHROMYCIN 250 MG PO TABS: 250.0000 mg | ORAL_TABLET | Freq: Every day | ORAL | 0 refills | Status: AC

## 2016-11-13 NOTE — Discharge Summary (Signed)
Physician Discharge Summary  Mark Berger UEA:[id number] DOB: 20-Jan-1924 DOA: 11/12/2016  PCP: Gaspar Garbe, MD  Admit date: 11/12/2016 Discharge date: 11/13/2016  Admitted From: [facility]  Disposition:  [facility]  Recommendations for Outpatient Follow-up:  1. Follow up with Urologist in  2-3 weeks 2. Please follow up on the following pending results:  Urine and blood cultures  Home Health:  Ongoing PT Equipment/Devices:  None  Discharge Condition:  Stable, improved CODE STATUS:  DNR  Diet recommendation:  regular  Brief/Interim Summary:  Mark Berger is a 83 y.o. male with PMH significant for hypothyroidism, BPH, HTN, DJD and chronic lower back pain; who presented with sudden onset of nausea, non bloody vomiting, diarrhea, suprapubic pain, increasing urinary frequency and general malaise.  He also had coughing spells and chills.  He was found to be slightly dehydrated, with elevated WBC's and actively throwing up.Found to have abnormal UA and early PNA process on CXR and CT abdomen. Cultures taken and patient started on IV antibiotics. TRH contacted to place in observation for further evaluation and treatment.  He felt immediately better after starting antibiotics and receiving IVF.  His diarrhea slowed and he was able to tolerate a soft diet.    Discharge Diagnoses:  Active Problems:   Chronic low back pain   BPH (benign prostatic hyperplasia)   Acute UTI   HTN (hypertension)   HLD (hyperlipidemia)   Hyperglycemia   Nausea, vomiting and diarrhea   CAP (community acquired pneumonia)  Acute Klebsiella UTI, present at time of admission.  He was started on ceftriaxone and immediately started to feel better.  He has had enterococcus and pseudomonas on previous urine cultures going back several years.  Transitioned to cefpodoxime at discharge to continue for 10 days.  Urine culture results will likely be final in the next one to two days to confirm sensitivities.   He should follow up with Dr. McDiarmid   Possible CAP.  He remained stable on room air.  Flu PCR was negative.  In addition to ceftriaxone, he was also given azithromycin for atypical coverage.  Recommend that he continue azithromycin to complete a z-pack.    Nausea, vomiting, and diarrhea, likely related to infections and improved with IVF and antibiotics.  He was able to tolerate a regular lunch prior to discharge.    Chronic low back pain, stable, continue home analgesic regimen   BPH (benign prostatic hyperplasia), stable, continued Rapaflo; according to he has failed therapy with flomax  HTN (hypertension), blood pressure stable, continued norvasc  HLD (hyperlipidemia), stable, continue lipitor  8-Hyperglycemia: no prior hx of diabetes, likely due to stress from infections, A1c 6.  9-Hypothyroidism, TSH wnl.  Not on synthroid currently   Discharge Instructions  Discharge Instructions    Call MD for:  difficulty breathing, headache or visual disturbances    Complete by:  As directed    Call MD for:  extreme fatigue    Complete by:  As directed    Call MD for:  hives    Complete by:  As directed    Call MD for:  persistant dizziness or light-headedness    Complete by:  As directed    Call MD for:  persistant nausea and vomiting    Complete by:  As directed    Call MD for:  severe uncontrolled pain    Complete by:  As directed    Call MD for:  temperature >100.4    Complete by:  As  directed    Diet - low sodium heart healthy    Complete by:  As directed    Increase activity slowly    Complete by:  As directed        Medication List    STOP taking these medications   senna 8.6 MG Tabs tablet Commonly known as:  SENOKOT     TAKE these medications   ALL DAY RELIEF 220 MG tablet Generic drug:  naproxen sodium Take 220 mg by mouth 2 (two) times daily with a meal.   amLODipine 5 MG tablet Commonly known as:  NORVASC Take 5 mg by mouth daily.   ANTI MONKEY  BUTT EX Apply 1 application topically 2 (two) times daily as needed (rash).   atorvastatin 10 MG tablet Commonly known as:  LIPITOR Take 10 mg by mouth at bedtime.   azithromycin 250 MG tablet Commonly known as:  ZITHROMAX Take 1 tablet (250 mg total) by mouth daily.   bisacodyl 5 MG EC tablet Commonly known as:  DULCOLAX Take 5 mg by mouth daily as needed (For constipation.).   cefpodoxime 200 MG tablet Commonly known as:  VANTIN Take 1 tablet (200 mg total) by mouth 2 (two) times daily.   cholecalciferol 1000 units tablet Commonly known as:  VITAMIN D Take 1,000 Units by mouth daily.   DAILY VITE Tabs Take 1 tablet by mouth daily.   HYDROcodone-acetaminophen 5-325 MG tablet Commonly known as:  NORCO/VICODIN Take 1 tablet by mouth every 4 (four) hours as needed for pain.   LORazepam 0.5 MG tablet Commonly known as:  ATIVAN Take 0.5 mg by mouth 2 (two) times daily.   MAPAP 500 MG tablet Generic drug:  acetaminophen Take 1,000 mg by mouth every 4 (four) hours as needed (For pain or fever over 101.).   RAPAFLO 8 MG Caps capsule Generic drug:  silodosin Take 8 mg by mouth every evening.   ROBAFEN DM 10-100 MG/5ML liquid Generic drug:  Dextromethorphan-Guaifenesin Take 5 mLs by mouth as directed.   SECURA PROTECTIVE 10 % Crea Generic drug:  ZINC OXIDE (TOPICAL) Apply 1 application topically at bedtime as needed (raised red areas of bilateral buttocks).   SYSTANE 0.4-0.3 % Soln Generic drug:  Polyethyl Glycol-Propyl Glycol Place 1 drop into both eyes 4 (four) times daily.   VENTOLIN HFA 108 (90 Base) MCG/ACT inhaler Generic drug:  albuterol Inhale 2 puffs into the lungs every 6 (six) hours as needed for wheezing.      Follow-up Information    Gaspar Garbeichard W Tisovec, MD Follow up.   Specialty:  Internal Medicine Why:  as needed Contact information: [address] [place] [address] [phone]        MACDIARMID,SCOTT A, MD. Schedule an appointment as  soon as possible for a visit in 2 week(s).   Specialty:  Urology Contact information: [address] [place] [address] [phone]          Allergies  Allergen Reactions  . Altace [Ramipril] Other (See Comments)    Reaction unknown  . Bactrim [Sulfamethoxazole-Trimethoprim] Rash  . Levaquin [Levofloxacin In D5w] Rash    Consultations: none  Procedures/Studies: Dg Chest 2 View  Result Date: 11/12/2016 CLINICAL DATA:  Nausea, vomiting diarrhea cough and shortness of breath for the past day. EXAM: CHEST  2 VIEW COMPARISON:  08/15/2012; 08/10/2012; chest CT - 08/11/2012 FINDINGS: Grossly unchanged cardiac silhouette and mediastinal contours given reduced lung volumes. There is persistent rightward deviation of the tracheal air, the level of  the aortic arch. Atherosclerotic plaque within the thoracic aorta. Mild pulmonary is congestion without frank evidence of edema. Worsening perihilar and bibasilar opacities favored to represent atelectasis. Potential developing airspace opacity with the peripheral aspect the right upper lung. No pleural effusion pneumothorax. No evidence of edema. No acute osseus abnormalities. IMPRESSION: 1. Potential developing airspace opacity with the peripheral aspect the right upper lung. A follow-up chest radiograph in 3 to 4 weeks after treatment is recommended to ensure resolution. 2. Decreased lung volumes with bibasilar opacities, likely atelectasis. 3. Pulmonary venous congestion without frank evidence of edema. 4. Aortic Atherosclerosis (ICD10-170.0) Electronically Signed   By: Simonne Come M.D.   On: 11/12/2016 07:53   Ct Abdomen Pelvis W Contrast  Result Date: 11/12/2016 CLINICAL DATA:  Nausea, vomiting, and diarrhea for several hours. White cell count 13.8. EXAM: CT ABDOMEN AND PELVIS WITH CONTRAST TECHNIQUE: Multidetector CT imaging of the abdomen and pelvis was performed using the standard protocol following bolus administration of intravenous  contrast. CONTRAST:  100 mL Isovue-300 COMPARISON:  02/11/2012 FINDINGS: Lower chest: Minimal bilateral pleural effusions. Interstitial infiltrates in the lung bases could represent pneumonia or edema. Mild bronchiectasis. Coronary artery calcifications. Cardiac enlargement. Small esophageal hiatal hernia. Hepatobiliary: Several circumscribed cysts in the liver, largest measuring 1.3 cm diameter. Gallbladder and bile ducts are unremarkable. Pancreas: Unremarkable. No pancreatic ductal dilatation or surrounding inflammatory changes. Spleen: Normal in size without focal abnormality. Adrenals/Urinary Tract: No adrenal gland nodules. Parapelvic cysts in the kidneys. Punctate size nonobstructing stones in the right kidney. No hydronephrosis or hydroureter. Bladder wall is not thickened and no bladder stones are identified. Stomach/Bowel: Diverticulosis of the colon. Stomach, small bowel, and colon are not abnormally distended. No inflammatory changes or wall thickening appreciated. Vascular/Lymphatic: Aortic atherosclerosis. No enlarged abdominal or pelvic lymph nodes. Reproductive: Prostate gland is enlarged measuring 6.8 cm in diameter. Other: Small periumbilical and left inguinal hernias containing fat. No free air or free fluid in the abdomen. Musculoskeletal: Degenerative changes in the spine. Compression this is unchanged since prior study. Of T12. IMPRESSION: Minimal bilateral pleural effusions with interstitial infiltrates in the lung bases likely representing edema although pneumonia could also have this appearance. Punctate sized nonobstructing stones in the right kidney. No evidence of bowel obstruction or inflammation. Enlarged prostate gland. Small periumbilical and left inguinal hernias containing fat. Electronically Signed   By: Burman Nieves M.D.   On: 11/12/2016 06:44    none   Subjective: Feeling much better today.  Would like to go home and shower at his own apartment.  Denies chest pains,  SOB, abdominal pains.  Nausea and vomiting have improved and he already ate a full liquids breakfast today.  No diarrhea overnight.    Discharge Exam: Vitals:   11/13/16 0622 11/13/16 1424  BP: 122/73 136/90  Pulse: 78 80  Resp: 18 18  Temp: 98.1 F (36.7 C) 97.7 F (36.5 C)   Vitals:   11/12/16 1800 11/12/16 1930 11/13/16 0622 11/13/16 1424  BP: (!) 105/50 (!) 143/77 122/73 136/90  Pulse: 71 82 78 80  Resp: 20 18 18 18   Temp:  98.7 F (37.1 C) 98.1 F (36.7 C) 97.7 F (36.5 C)  TempSrc:  Oral Oral Oral  SpO2: 92% 96% 93% 94%  Weight:  96.8 kg (213 lb 6.5 oz)    Height:  5\' 9"  (1.753 m)      General: adult male, no acute distress, alert, oriented, quick to remember my name and answer questions Cardiovascular: RRR, S1/S2 +,  no rubs, no gallops Respiratory:  Scattered rales at the bilateral bases, but no rhonchi or wheeze, no increased WOB Abdominal: Soft, NT, ND, bowel sounds +, no suprapubic tenderness Extremities: no edema, no cyanosis    The results of significant diagnostics from this hospitalization (including imaging, microbiology, ancillary and laboratory) are listed below for reference.     Microbiology: Recent Results (from the past 240 hour(s))  Urine culture     Status: Abnormal (Preliminary result)   Collection Time: 11/12/16  6:55 AM  Result Value Ref Range Status   Specimen Description URINE, CLEAN CATCH  Final   Special Requests NONE  Final   Culture (A)  Final    >=100,000 COLONIES/mL KLEBSIELLA PNEUMONIAE SUSCEPTIBILITIES TO FOLLOW Performed at [facility]    Report Status PENDING  Incomplete  Culture, blood (routine x 2) Call MD if unable to obtain prior to antibiotics being given     Status: None (Preliminary result)   Collection Time: 11/12/16  7:11 PM  Result Value Ref Range Status   Specimen Description   Final    BLOOD RIGHT ARM Performed at [facility]    Special Requests BOTTLES DRAWN AEROBIC AND ANAEROBIC EA   Final   Culture PENDING  Incomplete   Report Status PENDING  Incomplete  Culture, blood (routine x 2) Call MD if unable to obtain prior to antibiotics being given     Status: None (Preliminary result)   Collection Time: 11/12/16  7:12 PM  Result Value Ref Range Status   Specimen Description BLOOD LEFT ARM Performed at [facility]   Final   Special Requests BOTTLES DRAWN AEROBIC ONLY  Final   Culture PENDING  Incomplete   Report Status PENDING  Incomplete  MRSA PCR Screening     Status: Abnormal   Collection Time: 11/12/16  8:06 PM  Result Value Ref Range Status   MRSA by PCR POSITIVE (A) NEGATIVE Final    Comment:        The GeneXpert MRSA Assay (FDA approved for NASAL specimens only), is one component of a comprehensive MRSA colonization surveillance program. It is not intended to diagnose MRSA infection nor to guide or monitor treatment for MRSA infections. RESULT CALLED TO, READ BACK BY AND VERIFIED WITH: M.THOMPSON,RN 2152 11/12/16 W.SHEA      Labs: BNP (last 3 results) No results for input(s): BNP in the last 8760 hours. Basic Metabolic Panel:  Recent Labs Lab 11/12/16 0405 11/12/16 1912 11/13/16 0532  NA 135  --  138  K 4.0  --  3.7  CL 104  --  108  CO2 25  --  25  GLUCOSE 153*  --  106*  BUN 32*  --  19  CREATININE 0.83 0.74 0.63  CALCIUM 8.4*  --  7.9*  MG  --  1.9  --   PHOS  --  2.2*  --    Liver Function Tests:  Recent Labs Lab 11/12/16 0405 11/12/16 1912  AST 19 18  ALT 17 15*  ALKPHOS 66 66  BILITOT 1.3* 0.9  PROT 6.3* 6.0*  ALBUMIN 3.5 3.3*    Recent Labs Lab 11/12/16 0405  LIPASE 18   No results for input(s): AMMONIA in the last 168 hours. CBC:  Recent Labs Lab 11/12/16 0405 11/12/16 1912 11/13/16 0532  WBC 13.8* 9.0 6.2  NEUTROABS 12.8*  --   --   HGB 13.5 13.1 12.2*  HCT 40.3 39.1 36.8*  MCV 92.2 90.5  90.9  PLT 228 246 215   Cardiac Enzymes: No results for input(s): CKTOTAL, CKMB, CKMBINDEX, TROPONINI  in the last 168 hours. BNP: Invalid input(s): POCBNP CBG: No results for input(s): GLUCAP in the last 168 hours. D-Dimer No results for input(s): DDIMER in the last 72 hours. Hgb A1c  Recent Labs  11/12/16 1912  HGBA1C 6.0*   Lipid Profile No results for input(s): CHOL, HDL, LDLCALC, TRIG, CHOLHDL, LDLDIRECT in the last 72 hours. Thyroid function studies  Recent Labs  11/12/16 1912  TSH 0.665   Anemia work up No results for input(s): VITAMINB12, FOLATE, FERRITIN, TIBC, IRON, RETICCTPCT in the last 72 hours. Urinalysis    Component Value Date/Time   COLORURINE YELLOW 11/12/2016 0655   APPEARANCEUR HAZY (A) 11/12/2016 0655   LABSPEC 1.029 11/12/2016 0655   PHURINE 5.0 11/12/2016 0655   GLUCOSEU NEGATIVE 11/12/2016 0655   HGBUR NEGATIVE 11/12/2016 0655   BILIRUBINUR NEGATIVE 11/12/2016 0655   KETONESUR 5 (A) 11/12/2016 0655   PROTEINUR NEGATIVE 11/12/2016 0655   UROBILINOGEN 0.2 01/29/2013 1220   NITRITE POSITIVE (A) 11/12/2016 0655   LEUKOCYTESUR MODERATE (A) 11/12/2016 0655   Sepsis Labs Invalid input(s): PROCALCITONIN,  WBC,  LACTICIDVEN   Time coordinating discharge: Over 30 minutes  SIGNED:   Renae Fickle, MD  [facility] 11/13/2016, 2:48 PM Pager   If 7PM-7AM, please contact night-coverage [email or web address] Password [email or web address]

## 2016-11-13 NOTE — Clinical Social Work Placement (Signed)
   CLINICAL SOCIAL WORK PLACEMENT  NOTE  Date:  11/13/2016  Patient Details  Name: Mark Berger MRN: [id number] Date of Birth: 09/27/1924  Clinical Social Work is seeking post-discharge placement for this patient at the Assisted Living Facility level of care (*CSW will initial, date and re-position this form in  chart as items are completed):  No   Patient/family provided with [facility] Clinical Social Work Department's list of facilities offering this level of care within the geographic area requested by the patient (or if unable, by the patient's family).  No   Patient/family informed of their freedom to choose among providers that offer the needed level of care, that participate in Medicare, Medicaid or managed care program needed by the patient, have an available bed and are willing to accept the patient.  No   Patient/family informed of [facility]'s ownership interest in [facility] and [facility], as well as of the fact that they are under no obligation to receive care at these facilities.  PASRR submitted to EDS on       PASRR number received on       Existing PASRR number confirmed on       FL2 transmitted to all facilities in geographic area requested by pt/family on       FL2 transmitted to all facilities within larger geographic area on       Patient informed that his/her managed care company has contracts with or will negotiate with certain facilities, including the following:  [facility]     No   Patient/family informed of bed offers received.  [facility]     Physician [facility] and [facility]    [facility].  Patient to be transferred to facility by [facility]     Patient family notified on 11/13/16 of transfer.  Name of family member notified:  Patient reports no family. Facility Staff aware of  patient return      PHYSICIAN Please sign DNR, Please prepare priority discharge summary, including medications, Please sign FL2     Additional Comment:    _______________________________________________ Mark CootsNicole A Lee-Anne Flicker, LCSW 11/13/2016, 10:59 AM

## 2016-11-13 NOTE — Evaluation (Signed)
Physical Therapy Evaluation Patient Details Name: Mark FlatteryRobert C Berger MRN: [id number] DOB: 01/03/1924 Today's Date: 11/13/2016   History of Present Illness   83 y.o. male with PMH significant for hypothyroidism, BPH, HTN, DJD, R TKR with poor recovery,  and chronic lower back pain; who presented with sudden onset of nausea, non bloody vomiting, diarrhea and general malaise. Dx of UTI  Clinical Impression  Pt has been non-ambulatory for 4 years due to poor recovery from R TKR. At baseline [facility]. Today he transferred bed to recliner without assistance. From PT standpoint he is ready to DC to his ALF, no f/u PT needed.     Follow Up Recommendations No PT follow up    Equipment Recommendations  None recommended by PT    Recommendations for Other Services       Precautions / Restrictions Precautions Precautions: Fall Precaution Comments: pt denies falls in past 1 year Restrictions Weight Bearing Restrictions: No      Mobility  Bed Mobility Overal bed mobility: Modified Independent             General bed mobility comments: HOB up 30*  Transfers Overall transfer level: Needs assistance Equipment used: None Transfers: Squat Pivot Transfers     Squat pivot transfers: Supervision     General transfer comment: squat pivot from bed to recliner, no physical assist needed  Ambulation/Gait             General Gait Details: non-ambulatory at baseline  Stairs            Wheelchair Mobility    Modified Rankin (Stroke Patients Only)       Balance Overall balance assessment: Independent (independent with sitting balance, pt doesn't stand)                                           Pertinent Vitals/Pain Pain Assessment: No/denies pain    Home Living Family/patient expects to be discharged to:: Assisted living               Home Equipment: Wheelchair - manual      Prior  Function Level of Independence: Independent with assistive device(s)         Comments[facility]els WC independently, has supervision for shower chair transfers, independent bathing/dressing; non-ambulatory x 4 years 2* poor reovery from R TKR     Hand Dominance        Extremity/Trunk Assessment   Upper Extremity Assessment Upper Extremity Assessment: Overall WFL for tasks assessed    Lower Extremity Assessment Lower Extremity Assessment: RLE deficits/detail;LLE deficits/detail RLE Deficits / Details: knee extension -30* (2* poor recovery from TKR) LLE Deficits / Details: knee ext -20*    Cervical / Trunk Assessment Cervical / Trunk Assessment: Normal  Communication   Communication: HOH  Cognition Arousal/Alertness: Awake/alert Behavior During Therapy: WFL for tasks assessed/performed Overall Cognitive Status: Within Functional Limits for tasks assessed                 General Comments: 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    General Comments      Exercises     Assessment/Plan    PT Assessment Patent does not need any further PT services  PT Problem List  PT Treatment Interventions      PT Goals (Current goals can be found in the Care Plan section)  Acute Rehab PT Goals Patient Stated Goal: return to ALF PT Goal Formulation: All assessment and education complete, DC therapy    Frequency     Barriers to discharge        Co-evaluation               End of Session Equipment Utilized During Treatment: Gait belt Activity Tolerance: Patient tolerated treatment well Patient left: in chair;with call bell/phone within reach;with chair alarm set Nurse Communication: Mobility status    Functional Assessment Tool Used: clinical judgement Functional Limitation: Mobility: Walking and moving around Mobility: Walking and Moving Around Current Status (X0960): At least 20 percent but less than 40 percent impaired,  limited or restricted Mobility: Walking and Moving Around Goal Status [phone]): At least 20 percent but less than 40 percent impaired, limited or restricted Mobility: Walking and Moving Around Discharge Status [phone]): At least 20 percent but less than 40 percent impaired, limited or restricted    Time: 4782-9562 PT Time Calculation (min) (ACUTE ONLY): 22 min   Charges:   PT Evaluation $PT Eval Moderate Complexity: 1 Procedure     PT G Codes:   PT G-Codes **NOT FOR INPATIENT CLASS** Functional Assessment Tool Used: clinical judgement Functional Limitation: Mobility: Walking and moving around Mobility: Walking and Moving Around Current Status (Z3086): At least 20 percent but less than 40 percent impaired, limited or restricted Mobility: Walking and Moving Around Goal Status [phone]): At least 20 percent but less than 40 percent impaired, limited or restricted Mobility: Walking and Moving Around Discharge Status [phone]): At least 20 percent but less than 40 percent impaired, limited or restricted    Tamala Ser 11/13/2016, 2:03 PM  [phone]

## 2016-11-13 NOTE — [place] FL2 (Signed)
[place] MEDICAID FL2 LEVEL OF CARE SCREENING TOOL     IDENTIFICATION  Patient Name: Mark Berger Birthdate: 04-11-1924 Sex: male Admission Date (Current Location): 11/12/2016  [facility] and [place] Number:  Producer, television/film/video and Address:  [facility],  [address] [place], [place] [address]      Provider Number: [id number]  Attending Physician Name and Address:  Renae Fickle, MD  Relative Name and Phone Number:       Current Level of Care: Other (Comment) (Assisted Living Facility) Recommended Level of Care: Assisted Living Facility Prior Approval Number:    Date Approved/Denied:   PASRR Number:    Discharge Plan:  (Assited Living Facility )    Current Diagnoses: Patient Active Problem List   Diagnosis Date Noted  . Acute UTI 11/12/2016  . HTN (hypertension) 11/12/2016  . HLD (hyperlipidemia) 11/12/2016  . Hyperglycemia 11/12/2016  . Nausea, vomiting and diarrhea 11/12/2016  . CAP (community acquired pneumonia) 11/12/2016  . Boil on right hamstring 09/19/2012  . Shortness of breath   . Anxiety   . BPH (benign prostatic hyperplasia)   . MRSA (methicillin resistant Staphylococcus aureus) carrier 08/11/2012  . Arthritis   . Kidney calculi   . Right knee DJD   . Syncope 12/19/2011    Class: Acute  . Trigeminal neuralgia 12/19/2011  . Osteoarthritis of knee 12/19/2011  . Urinary tract infection 12/19/2011    Class: Acute  . Benign prostate hyperplasia 12/19/2011  . Chronic low back pain 12/19/2011    Class: Chronic    Orientation RESPIRATION BLADDER Height & Weight     Time, Self, Situation, Place  Normal Continent Weight: 213 lb 6.5 oz (96.8 kg) Height:  5\' 9"  (175.3 cm)  BEHAVIORAL SYMPTOMS/MOOD NEUROLOGICAL BOWEL NUTRITION STATUS      Continent Diet  AMBULATORY STATUS COMMUNICATION OF NEEDS Skin   Total Care Verbally Normal                       Personal Care Assistance Level of Assistance  Bathing, Feeding, Dressing Bathing  Assistance: Limited assistance Feeding assistance: Independent Dressing Assistance: Independent     Functional Limitations Info  Sight, Hearing, Speech Sight Info: Adequate Hearing Info: Impaired Speech Info: Adequate    SPECIAL CARE FACTORS FREQUENCY                       Contractures Contractures Info: Not present    Additional Factors Info  Code Status, Allergies, Isolation Precautions Code Status Info: DNR Allergies Info: Altace Ramipril, Bactrim Sulfamethoxazole-trimethoprim, Levaquin Levofloxacin In D5w     Isolation Precautions Info: MRSA     Current Medications (11/13/2016):  This is the current hospital active medication list Current Facility-Administered Medications  Medication Dose Route Frequency Provider Last Rate Last Dose  . acetaminophen (TYLENOL) tablet 650 mg  650 mg Oral Q6H PRN Vassie Loll, MD      . amLODipine (NORVASC) tablet 5 mg  5 mg Oral Daily Vassie Loll, MD   5 mg at 11/13/16 1044  . atorvastatin (LIPITOR) tablet 10 mg  10 mg Oral QHS Vassie Loll, MD   10 mg at 11/12/16 2129  . azithromycin (ZITHROMAX) tablet 500 mg  500 mg Oral Daily Danford Bad, RPH   500 mg at 11/13/16 1256  . bisacodyl (DULCOLAX) EC tablet 5 mg  5 mg Oral Daily PRN Vassie Loll, MD      . cefTRIAXone (ROCEPHIN) 1 g in dextrose  5 % 50 mL IVPB  1 g Intravenous Q24H Vassie Loll, MD   1 g at 11/13/16 1043  . Chlorhexidine Gluconate Cloth 2 % PADS 6 each  6 each Topical Q0600 Vassie Loll, MD   6 each at 11/13/16 0600  . cholecalciferol (VITAMIN D) tablet 1,000 Units  1,000 Units Oral Daily Vassie Loll, MD   1,000 Units at 11/13/16 1045  . enoxaparin (LOVENOX) injection 40 mg  40 mg Subcutaneous Q24H Vassie Loll, MD   40 mg at 11/12/16 2130  . guaiFENesin-dextromethorphan (ROBITUSSIN DM) 100-10 MG/5ML syrup 5 mL  5 mL Oral Q8H PRN Vassie Loll, MD      . HYDROcodone-acetaminophen (NORCO/VICODIN) 5-325 MG per tablet 1 tablet  1 tablet Oral Q6H PRN Vassie Loll,  MD      . ipratropium-albuterol (DUONEB) 0.5-2.5 (3) MG/3ML nebulizer solution 3 mL  3 mL Nebulization Q6H PRN Vassie Loll, MD      . LORazepam (ATIVAN) tablet 0.5 mg  0.5 mg Oral BID Vassie Loll, MD   0.5 mg at 11/13/16 1045  . multivitamin with minerals tablet 1 tablet  1 tablet Oral Daily Vassie Loll, MD   1 tablet at 11/13/16 1044  . mupirocin ointment (BACTROBAN) 2 % 1 application  1 application Nasal BID Vassie Loll, MD   1 application at 11/13/16 1044  . naproxen (NAPROSYN) tablet 250 mg  250 mg Oral BID WC Vassie Loll, MD   250 mg at 11/13/16 1044  . ondansetron (ZOFRAN) tablet 4 mg  4 mg Oral Q6H PRN Vassie Loll, MD       Or  . ondansetron [facility]) injection 4 mg  4 mg Intravenous Q6H PRN Vassie Loll, MD      . polyvinyl alcohol (LIQUIFILM TEARS) 1.4 % ophthalmic solution 1 drop  1 drop Both Eyes QID Vassie Loll, MD   1 drop at 11/13/16 1300  . silodosin (RAPAFLO) capsule 8 mg  8 mg Oral QHS Vassie Loll, MD   8 mg at 11/12/16 2130  . sodium chloride (OCEAN) 0.65 % nasal spray 1 spray  1 spray Each Nare PRN Vassie Loll, MD   1 spray at 11/12/16 2320     Discharge Medications: Medication List    STOP taking these medications   senna 8.6 MG Tabs tablet Commonly known as:  SENOKOT    TAKE these medications   ALL DAY RELIEF 220 MG tablet Generic drug:  naproxen sodium Take 220 mg by mouth 2 (two) times daily with a meal.  amLODipine 5 MG tablet Commonly known as:  NORVASC Take 5 mg by mouth daily.  ANTI MONKEY BUTT EX Apply 1 application topically 2 (two) times daily as needed (rash).  atorvastatin 10 MG tablet Commonly known as:  LIPITOR Take 10 mg by mouth at bedtime.  azithromycin 250 MG tablet Commonly known as:  ZITHROMAX Take 1 tablet (250 mg total) by mouth daily.  bisacodyl 5 MG EC tablet Commonly known as:  DULCOLAX Take 5 mg by mouth daily as needed (For constipation.).  cefpodoxime 200 MG tablet Commonly known as:  VANTIN Take 1 tablet  (200 mg total) by mouth 2 (two) times daily.  cholecalciferol 1000 units tablet Commonly known as:  VITAMIN D Take 1,000 Units by mouth daily.  DAILY VITE Tabs Take 1 tablet by mouth daily.  HYDROcodone-acetaminophen 5-325 MG tablet Commonly known as:  NORCO/VICODIN Take 1 tablet by mouth every 4 (four) hours as needed for pain.  LORazepam 0.5 MG tablet Commonly known  as:  ATIVAN Take 0.5 mg by mouth 2 (two) times daily.  MAPAP 500 MG tablet Generic drug:  acetaminophen Take 1,000 mg by mouth every 4 (four) hours as needed (For pain or fever over 101.).  RAPAFLO 8 MG Caps capsule Generic drug:  silodosin Take 8 mg by mouth every evening.  ROBAFEN DM 10-100 MG/5ML liquid Generic drug:  Dextromethorphan-Guaifenesin Take 5 mLs by mouth as directed.  SECURA PROTECTIVE 10 % Crea Generic drug:  ZINC OXIDE (TOPICAL) Apply 1 application topically at bedtime as needed (raised red areas of bilateral buttocks).  SYSTANE 0.4-0.3 % Soln Generic drug:  Polyethyl Glycol-Propyl Glycol Place 1 drop into both eyes 4 (four) times daily.  VENTOLIN HFA 108 (90 Base) MCG/ACT inhaler Generic drug:  albuterol Inhale 2 puffs into the lungs every 6 (six) hours as needed for wheezing.       Relevant Imaging Results:  Relevant Lab Results:   Additional Information ssn:[id number]  Clearance CootsNicole A Dwayn Moravek, LCSW

## 2016-11-13 NOTE — Care Management Note (Addendum)
Case Management Note  Patient Details  Name: Mark FlatteryRobert C Berger MRN: [id number] Date of Birth: 07/06/1924  Subjective/Objective:  83 y/o m admitted w/UTI. From ALF-[facility]. PT cons-await recc.PT recc HHPT. [facility] has their own HHPT-faxed w/confirmation to fax#[phone] h&p,d/c summary,HHPT order,[id number]. No further CM needs.                   Action/Plan:d/c ALF   Expected Discharge Date:   (unknown)               Expected Discharge Plan:  Assisted Living / Rest Home  In-House Referral:  Clinical Social Work  Discharge planning Services  CM Consult  Post Acute Care Choice:    Choice offered to:     DME Arranged:    DME Agency:     HH Arranged:    HH Agency:     Status of Service:  In process, will continue to follow  If discussed at Long Length of Stay Meetings, dates discussed:    Additional Comments:  Mark Berger, Mark Livingstone, RN 11/13/2016, 12:37 PM

## 2016-11-13 NOTE — Progress Notes (Signed)
Patient was transported via [facility] back to [facility], ALF. Report given to Albertha GheeBelinda Logan, [facility] [employer]. No questions or concerns at this time.

## 2016-11-13 NOTE — Progress Notes (Signed)
Pharmacy IV to PO conversion  This patient is receiving Azithromycin by the intravenous route. Based on criteria approved by the Pharmacy and Therapeutics Committee, and the Infectious Disease Division, the antibiotic(s) is/are being converted to equivalent oral dose form(s). These criteria include:   Patient being treated for a respiratory tract infection, urinary tract infection, cellulitis, or Clostridium Difficile Associated Diarrhea  The patient is not neutropenic and does not exhibit a GI malabsorption state  The patient is eating (either orally or per tube) and/or has been taking other orally administered medications for at least 24 hours.  The patient is improving clinically (physician assessment and a 24-hour Tmax of <=100.5 F)  If you have any questions about this conversion, please contact the Pharmacy Department (ext [phone]).  Thank you.  Bernadene Personrew Sie Formisano, PharmD Pager: [phone] 11/13/2016, 10:28 AM

## 2016-11-13 NOTE — Care Management Obs Status (Signed)
MEDICARE OBSERVATION STATUS NOTIFICATION   Patient Details  Name: Mark FlatteryRobert C Karczewski MRN: [id number] Date of Birth: 06/26/1924   Medicare Observation Status Notification Given:  Yes    MahabirOlegario Messier, Sheanna Dail, RN 11/13/2016, 12:39 PM

## 2016-11-14 LAB — HIV ANTIBODY (ROUTINE TESTING W REFLEX): HIV SCREEN 4TH GENERATION: NONREACTIVE

## 2016-11-14 LAB — URINE CULTURE

## 2016-11-14 LAB — LEGIONELLA PNEUMOPHILA SEROGP 1 UR AG: L. PNEUMOPHILA SEROGP 1 UR AG: NEGATIVE

## 2016-11-17 ENCOUNTER — Encounter: Payer: Self-pay | Admitting: Podiatry

## 2016-11-17 ENCOUNTER — Ambulatory Visit (INDEPENDENT_AMBULATORY_CARE_PROVIDER_SITE_OTHER): Payer: Medicare Other | Admitting: Podiatry

## 2016-11-17 VITALS — BP 123/69 | HR 86

## 2016-11-17 DIAGNOSIS — B351 Tinea unguium: Secondary | ICD-10-CM

## 2016-11-17 DIAGNOSIS — M79676 Pain in unspecified toe(s): Secondary | ICD-10-CM | POA: Diagnosis not present

## 2016-11-17 LAB — CULTURE, BLOOD (ROUTINE X 2)
CULTURE: NO GROWTH
Culture: NO GROWTH

## 2016-11-17 NOTE — Patient Instructions (Signed)
Return every 3 months for debridement of mycotic toenails  Carrington Clampichard C Bayler Gehrig DPM [facility] 11/17/2016

## 2016-11-17 NOTE — Progress Notes (Signed)
Patient ID: Mark FlatteryRobert C Berger, male   DOB: 11/21/1923, 83 y.o.   MRN: [id number]    Subjective: This patient presents for scheduled visit again requesting debridement of painful toenails which are uncomfortable walking wearing shoes  Objective: Orientated 3 Patient transfers from wheelchair to treatment chair DP and PT pulses 2/4 bilaterally Capillary reflex immediate bilaterally Sensation to 10 g monofilament wire intact 4/5 right 0/5 left Vibratory sensation nonreactive bilaterally Ankle reflexes reactive bilaterally He says with his knees flexed bilaterally The toenails are hypertrophic, brittle, discolored, incurvated and tender to direct palpation 6-10  Plan: Symptomatic onychomycoses 6-10  Plan: Debrided toenails 10 and mechanically and electrically. Without any bleeding  Reappoint 3 months

## 2017-02-16 ENCOUNTER — Ambulatory Visit (INDEPENDENT_AMBULATORY_CARE_PROVIDER_SITE_OTHER): Payer: Medicare Other | Admitting: Podiatry

## 2017-02-16 ENCOUNTER — Encounter: Payer: Self-pay | Admitting: Podiatry

## 2017-02-16 DIAGNOSIS — B351 Tinea unguium: Secondary | ICD-10-CM

## 2017-02-16 DIAGNOSIS — M79676 Pain in unspecified toe(s): Secondary | ICD-10-CM | POA: Diagnosis not present

## 2017-02-16 NOTE — Progress Notes (Signed)
Patient ID: Mark Berger, male   DOB: 04-14-1924, 83 y.o.   MRN: [id number]   Subjective: This patient presents for scheduled visit again requesting debridement of painful toenails which are uncomfortable walking wearing shoes  Objective: Orientated 3 Patient transfers from wheelchair to treatment chair DP and PT pulses 2/4 bilaterally Capillary reflex immediate bilaterally Sensation to 10 g monofilament wire intact 4/5 right 0/5 left Vibratory sensation nonreactive bilaterally Ankle reflexes reactive bilaterally He says with his knees flexed bilaterally The toenails are hypertrophic, brittle, discolored, incurvated and tender to direct palpation 6-10  Plan: Symptomatic onychomycoses 6-10  Plan: Debride toenails 10 and mechanically and electrically. Without any bleeding  Reappoint 3 months

## 2017-02-16 NOTE — Patient Instructions (Signed)
Return every 3 months for debridement of mycotic toenails  Carrington Clamp DPM [facility] foot [facility] ankle 02/16/2017

## 2017-02-25 IMAGING — CT CT ABD-PELV W/ CM
2 of 5 series · 15 of 46 positions shown, 17 images · IV contrast (agent unspecified)
Comparison: 02/11/2012

CLINICAL DATA: Nausea, vomiting, and diarrhea for several hours.
White cell count 13.8.

EXAM:
CT ABDOMEN AND PELVIS WITH CONTRAST
TECHNIQUE: Multidetector CT imaging of the abdomen and pelvis was performed
using the standard protocol following bolus administration of
intravenous contrast.
CONTRAST:  100 mL [id number]

[Series 2: abd/pel with · axial · 0.88mm/px · z∈[+919,+1379]mm · 12 of 106 slices shown, 14 images]
[im 7/106  soft-tissue]
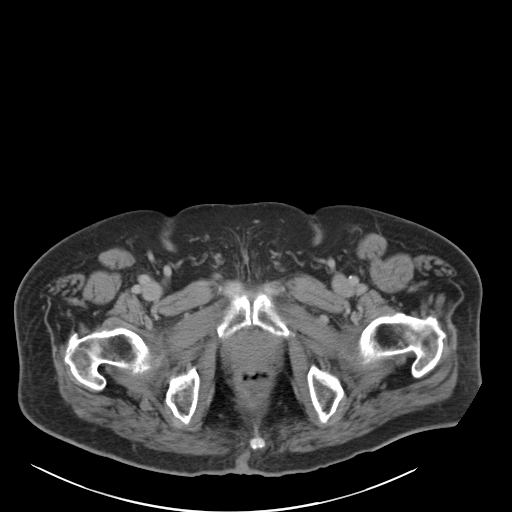
[im 7/106  bone]
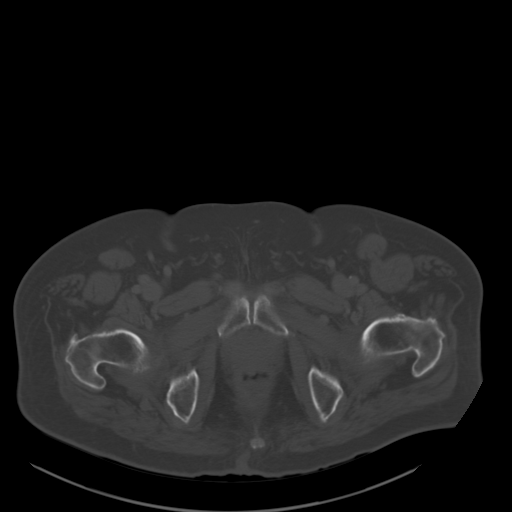
[im 14/106  soft-tissue]
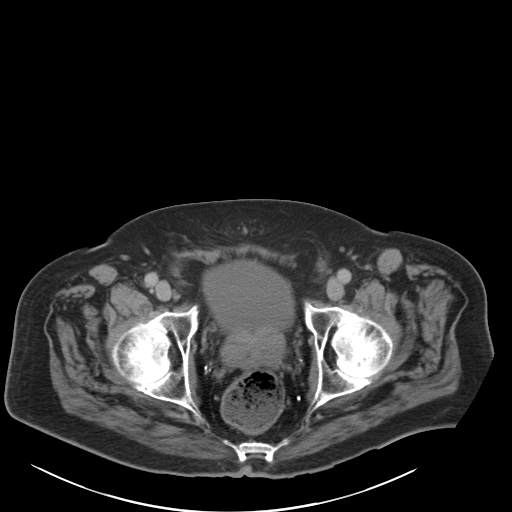
[im 27/106  soft-tissue]
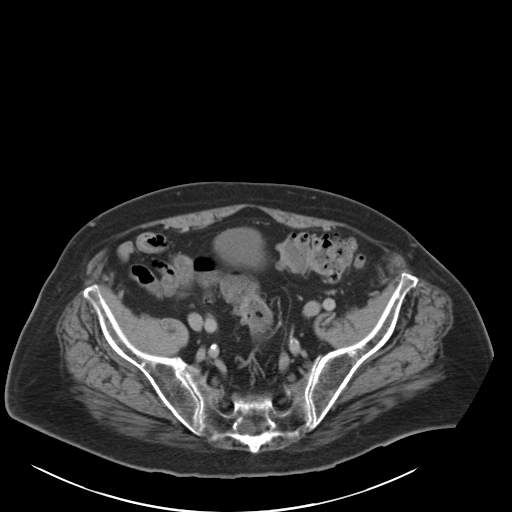
[im 33/106  soft-tissue]
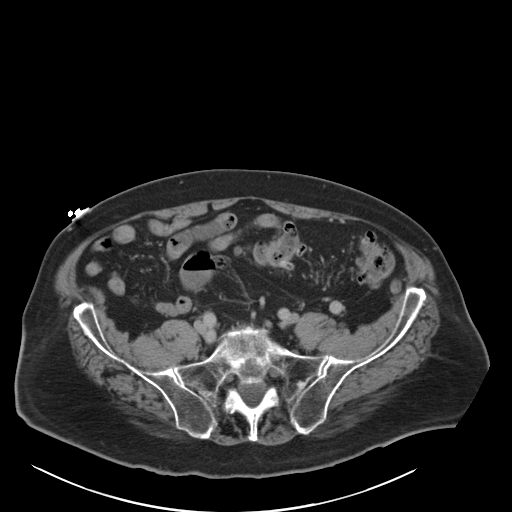
[im 40/106  soft-tissue]
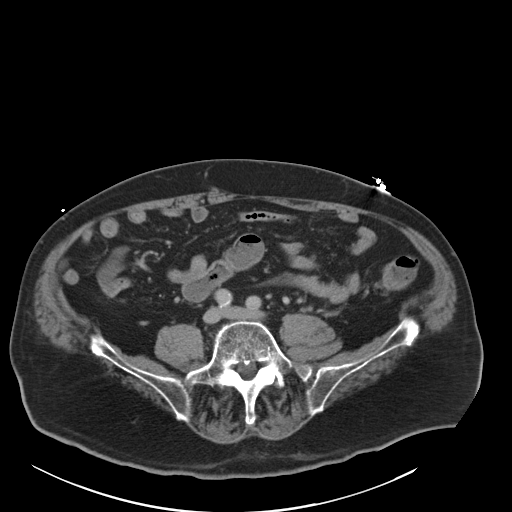
[im 46/106  soft-tissue]
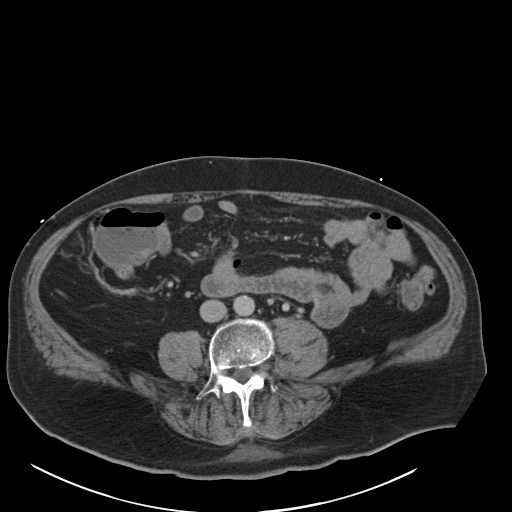
[im 60/106  soft-tissue]
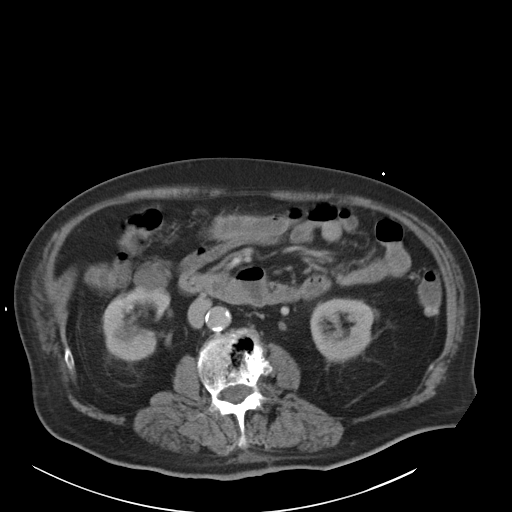
[im 66/106  soft-tissue]
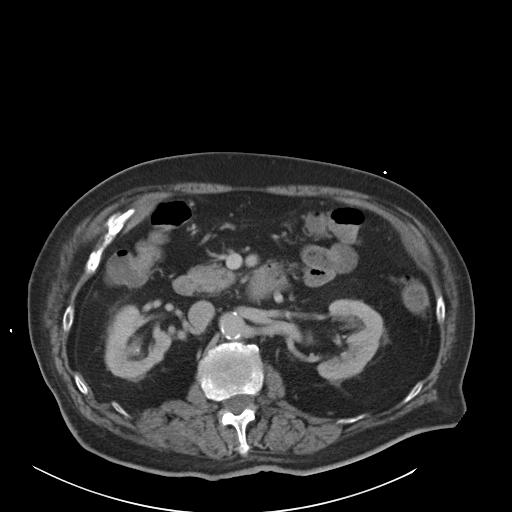
[im 73/106  soft-tissue]
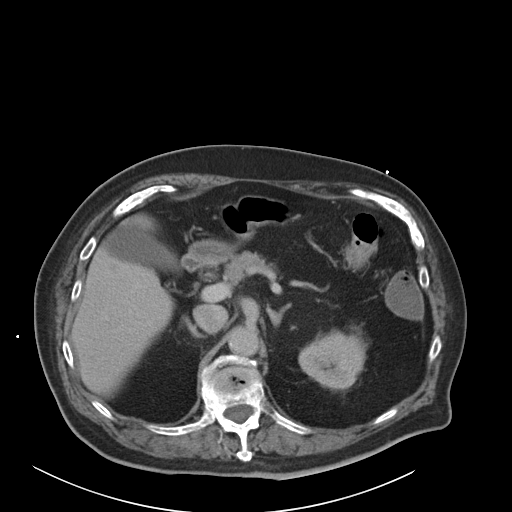
[im 73/106  bone]
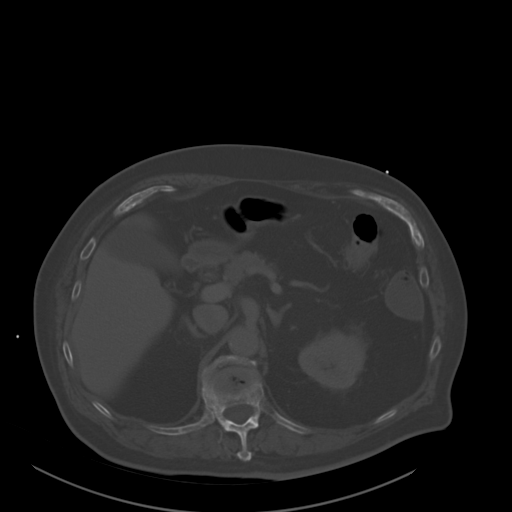
[im 79/106  soft-tissue]
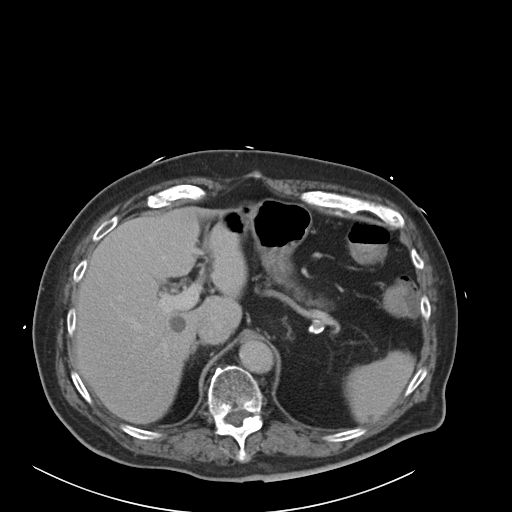
[im 92/106  soft-tissue]
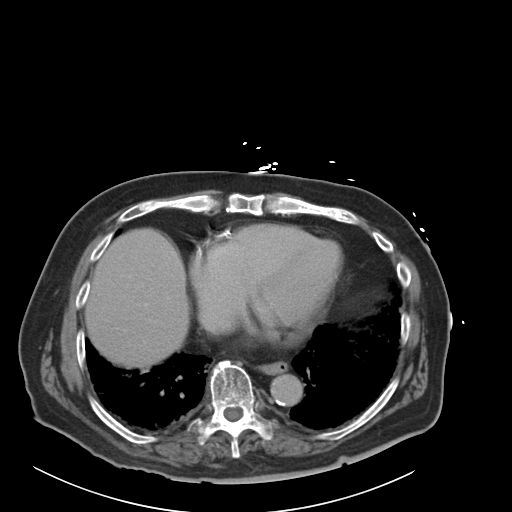
[im 99/106  soft-tissue]
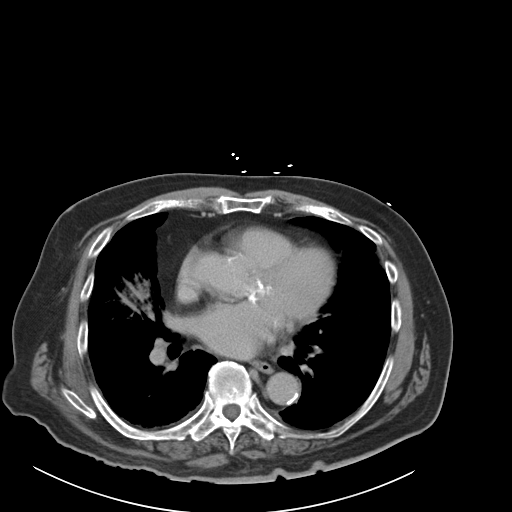

[Series 5: coronal a/|p · coronal · 0.76mm/px · 3 of 135 slices shown]
[im 45/135  soft-tissue]
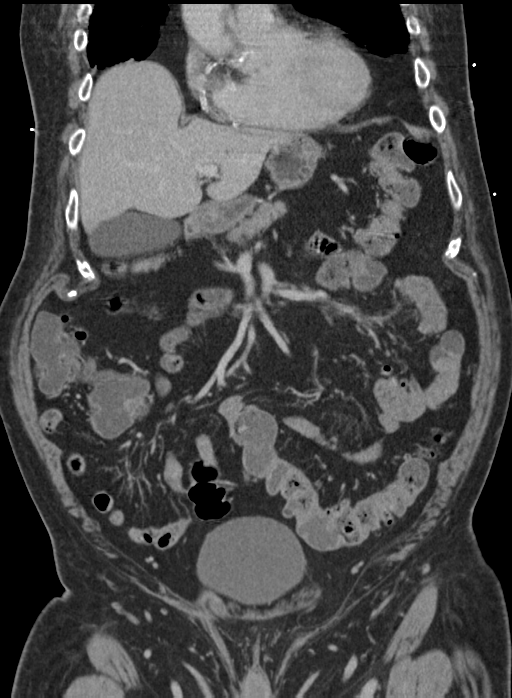
[im 60/135  soft-tissue]
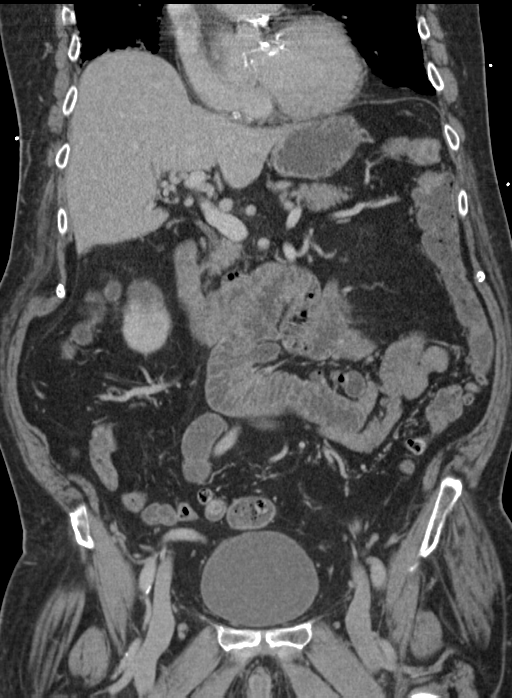
[im 75/135  soft-tissue]
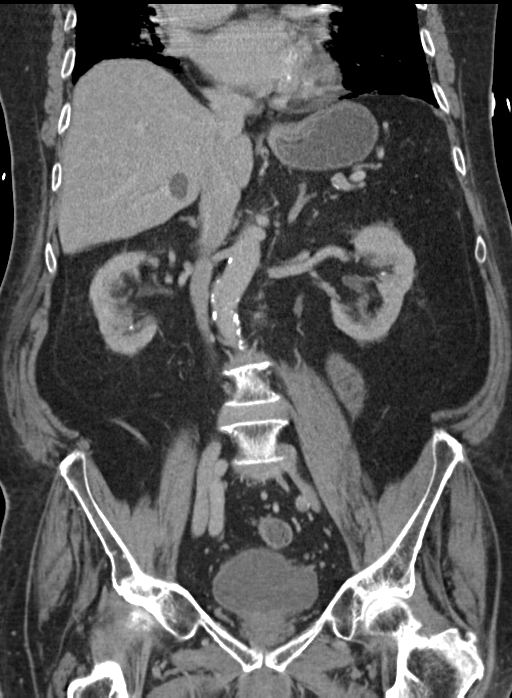

[15 of 46 positions shown; findings below may reference images not displayed]

FINDINGS: Lower chest: Minimal bilateral pleural effusions. Interstitial
infiltrates in the lung bases could represent pneumonia or edema.
Mild bronchiectasis. Coronary artery calcifications. Cardiac
enlargement. Small esophageal hiatal hernia.

Hepatobiliary: Several circumscribed cysts in the liver, largest
measuring 1.3 cm diameter. Gallbladder and bile ducts are
unremarkable.

Pancreas: Unremarkable. No pancreatic ductal dilatation or
surrounding inflammatory changes.

Spleen: Normal in size without focal abnormality.

Adrenals/Urinary Tract: No adrenal gland nodules. Parapelvic cysts
in the kidneys. Punctate size nonobstructing stones in the right
kidney. No hydronephrosis or hydroureter. Bladder wall is not
thickened and no bladder stones are identified.

Stomach/Bowel: Diverticulosis of the colon. Stomach, small bowel,
and colon are not abnormally distended. No inflammatory changes or
wall thickening appreciated.

Vascular/Lymphatic: Aortic atherosclerosis. No enlarged abdominal or
pelvic lymph nodes.

Reproductive: Prostate gland is enlarged measuring 6.8 cm in
diameter.

Other: Small periumbilical and left inguinal hernias containing fat.
No free air or free fluid in the abdomen.

Musculoskeletal: Degenerative changes in the spine. Compression this
is unchanged since prior study. Of T12.
IMPRESSION: Minimal bilateral pleural effusions with interstitial infiltrates in
the lung bases likely representing edema although pneumonia could
also have this appearance. Punctate sized nonobstructing stones in
the right kidney. No evidence of bowel obstruction or inflammation.
Enlarged prostate gland. Small periumbilical and left inguinal
hernias containing fat.

## 2017-02-25 IMAGING — CR DG CHEST 2V
2 series · 2 of 2 positions shown · non-contrast
Comparison: 08/15/2012; 08/10/2012; chest CT - 08/11/2012

CLINICAL DATA: Nausea, vomiting diarrhea cough and shortness of
breath for the past day.

EXAM:
CHEST  2 VIEW

[x chest ap]
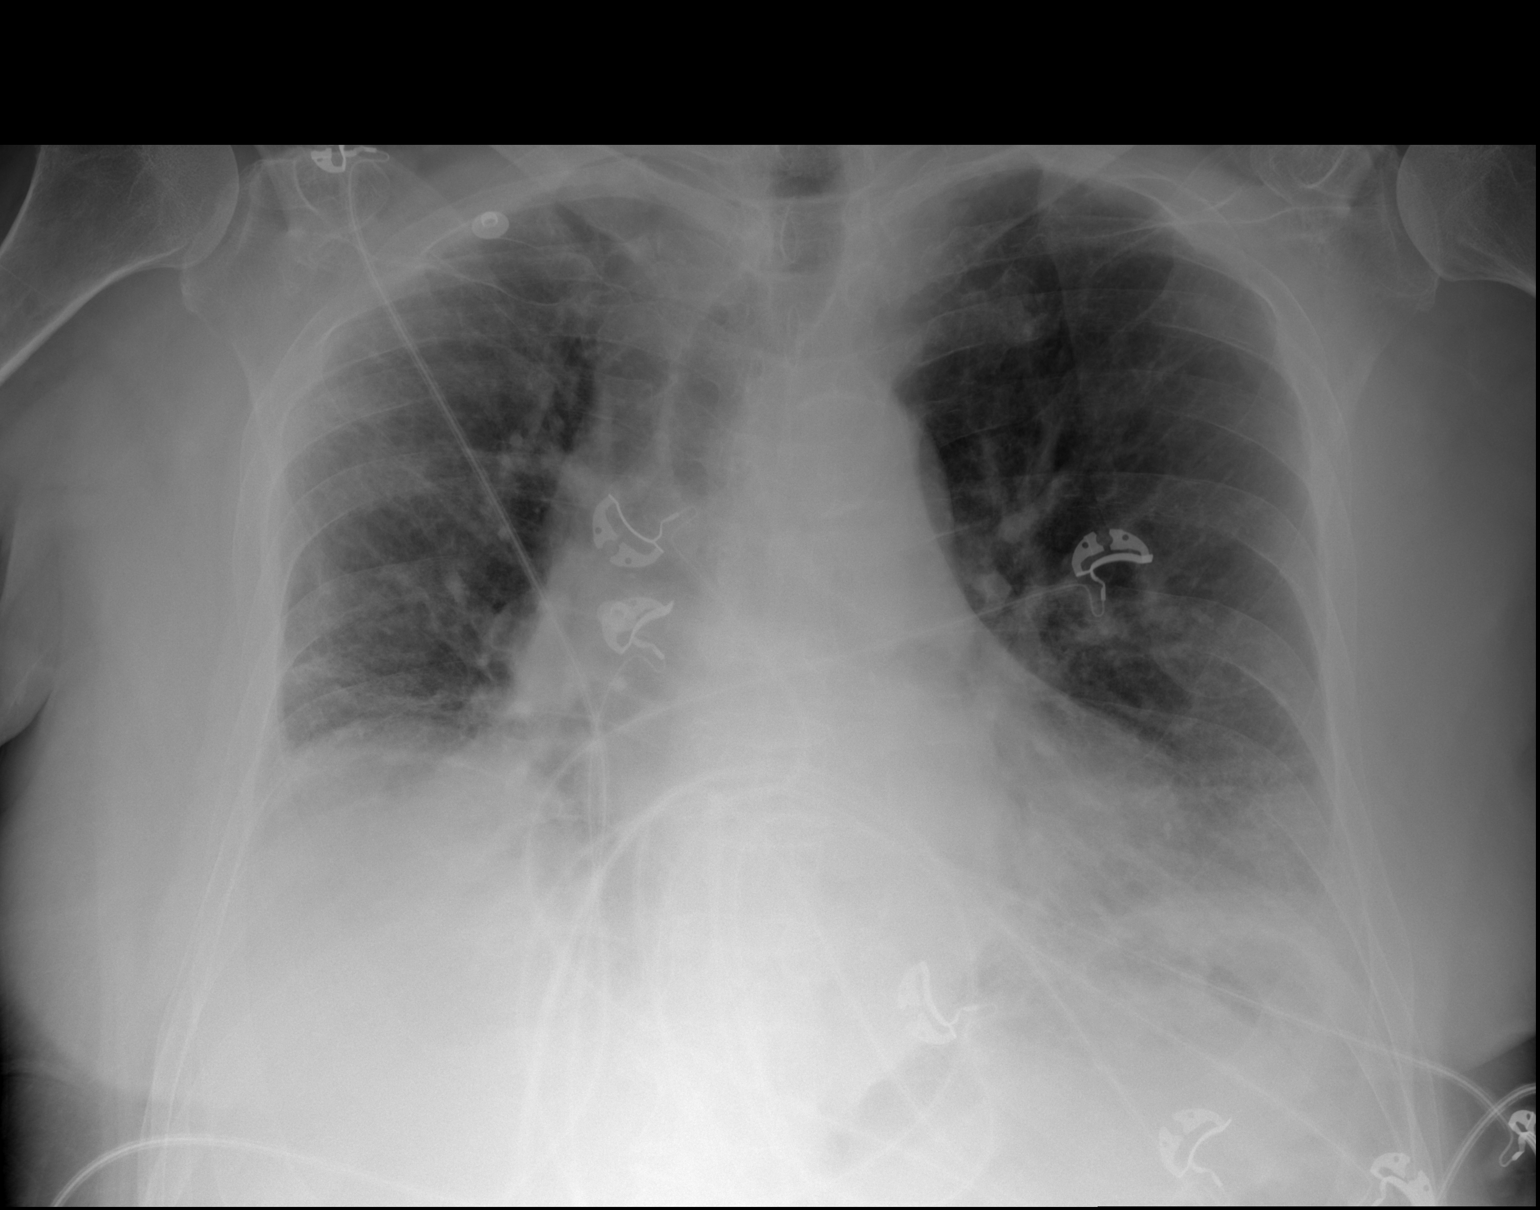

[w chest lat]
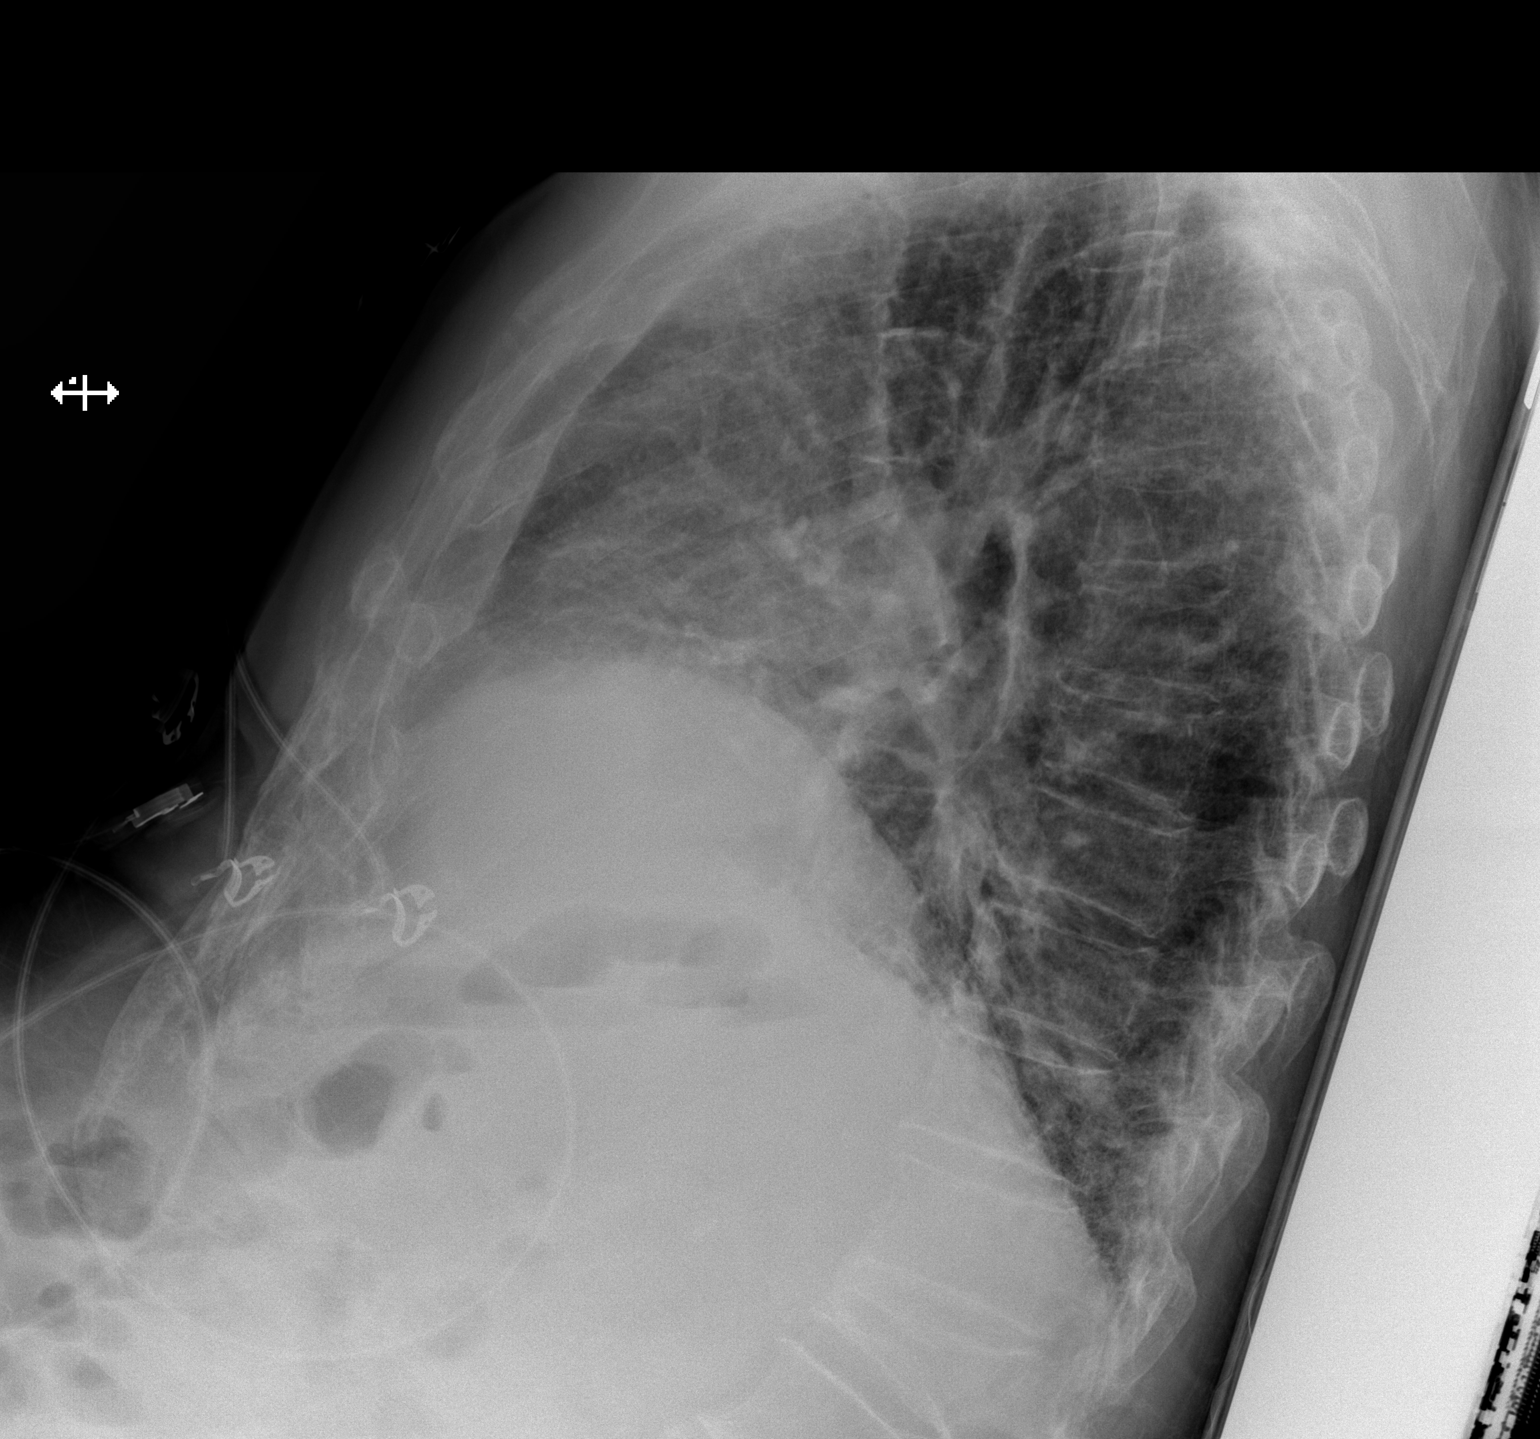

[2 of 2 positions shown; findings below may reference images not displayed]

FINDINGS: Grossly unchanged cardiac silhouette and mediastinal contours given
reduced lung volumes. There is persistent rightward deviation of the
tracheal air, the level of the aortic arch. Atherosclerotic plaque
within the thoracic aorta. Mild pulmonary is congestion without
frank evidence of edema. Worsening perihilar and bibasilar opacities
favored to represent atelectasis. Potential developing airspace
opacity with the peripheral aspect the right upper lung. No pleural
effusion pneumothorax. No evidence of edema. No acute osseus
abnormalities.
IMPRESSION: 1. Potential developing airspace opacity with the peripheral aspect
the right upper lung. A follow-up chest radiograph in 3 to 4 weeks
after treatment is recommended to ensure resolution.
2. Decreased lung volumes with bibasilar opacities, likely
atelectasis.
3. Pulmonary venous congestion without frank evidence of edema.
4. Aortic Atherosclerosis ([id number])

## 2017-03-02 DEATH — deceased

## 2017-05-18 ENCOUNTER — Encounter (INDEPENDENT_AMBULATORY_CARE_PROVIDER_SITE_OTHER): Payer: Medicare Other | Admitting: Podiatry

## 2017-05-18 NOTE — Progress Notes (Signed)
This encounter was created in error - please disregard.
